# Patient Record
Sex: Male | Born: 1940 | Race: White | Hispanic: No | Marital: Married | Smoking: Never smoker
Health system: Southern US, Community
[De-identification: ages and names within clinical notes are randomized; demographics above are authoritative.]

## PROBLEM LIST (undated history)

## (undated) DIAGNOSIS — M5416 Radiculopathy, lumbar region: Secondary | ICD-10-CM

## (undated) DIAGNOSIS — I099 Rheumatic heart disease, unspecified: Secondary | ICD-10-CM

## (undated) DIAGNOSIS — I10 Essential (primary) hypertension: Secondary | ICD-10-CM

## (undated) DIAGNOSIS — C679 Malignant neoplasm of bladder, unspecified: Secondary | ICD-10-CM

## (undated) DIAGNOSIS — I350 Nonrheumatic aortic (valve) stenosis: Secondary | ICD-10-CM

## (undated) DIAGNOSIS — M19012 Primary osteoarthritis, left shoulder: Secondary | ICD-10-CM

## (undated) DIAGNOSIS — M19011 Primary osteoarthritis, right shoulder: Secondary | ICD-10-CM

## (undated) DIAGNOSIS — M7138 Other bursal cyst, other site: Secondary | ICD-10-CM

## (undated) HISTORY — DX: Primary osteoarthritis, left shoulder: M19.012

## (undated) HISTORY — DX: Other bursal cyst, other site: M71.38

## (undated) HISTORY — DX: Nonrheumatic aortic (valve) stenosis: I35.0

## (undated) HISTORY — PX: HERNIA REPAIR: SHX51

## (undated) HISTORY — DX: Primary osteoarthritis, right shoulder: M19.011

## (undated) HISTORY — PX: APPENDECTOMY: SHX54

## (undated) HISTORY — DX: Radiculopathy, lumbar region: M54.16

## (undated) HISTORY — DX: Malignant neoplasm of bladder, unspecified: C67.9

---

## 2015-03-24 ENCOUNTER — Other Ambulatory Visit: Payer: Self-pay | Admitting: Family Medicine

## 2015-03-24 DIAGNOSIS — R10819 Abdominal tenderness, unspecified site: Secondary | ICD-10-CM

## 2015-03-24 DIAGNOSIS — R1903 Right lower quadrant abdominal swelling, mass and lump: Secondary | ICD-10-CM

## 2015-04-01 ENCOUNTER — Other Ambulatory Visit: Payer: Self-pay

## 2015-07-08 DIAGNOSIS — Z01810 Encounter for preprocedural cardiovascular examination: Secondary | ICD-10-CM

## 2015-07-08 DIAGNOSIS — I10 Essential (primary) hypertension: Secondary | ICD-10-CM

## 2015-07-08 HISTORY — DX: Essential (primary) hypertension: I10

## 2015-07-08 HISTORY — DX: Encounter for preprocedural cardiovascular examination: Z01.810

## 2015-07-13 DIAGNOSIS — K4020 Bilateral inguinal hernia, without obstruction or gangrene, not specified as recurrent: Secondary | ICD-10-CM | POA: Insufficient documentation

## 2015-07-13 DIAGNOSIS — R1031 Right lower quadrant pain: Secondary | ICD-10-CM | POA: Insufficient documentation

## 2015-07-13 DIAGNOSIS — K439 Ventral hernia without obstruction or gangrene: Secondary | ICD-10-CM

## 2015-07-13 HISTORY — DX: Ventral hernia without obstruction or gangrene: K43.9

## 2015-07-13 HISTORY — DX: Right lower quadrant pain: R10.31

## 2015-07-13 HISTORY — DX: Bilateral inguinal hernia, without obstruction or gangrene, not specified as recurrent: K40.20

## 2015-09-29 DIAGNOSIS — Z09 Encounter for follow-up examination after completed treatment for conditions other than malignant neoplasm: Secondary | ICD-10-CM

## 2015-09-29 HISTORY — DX: Encounter for follow-up examination after completed treatment for conditions other than malignant neoplasm: Z09

## 2018-02-09 DIAGNOSIS — R55 Syncope and collapse: Secondary | ICD-10-CM

## 2018-02-09 DIAGNOSIS — I248 Other forms of acute ischemic heart disease: Secondary | ICD-10-CM

## 2018-02-09 DIAGNOSIS — I350 Nonrheumatic aortic (valve) stenosis: Secondary | ICD-10-CM

## 2018-02-09 DIAGNOSIS — I249 Acute ischemic heart disease, unspecified: Secondary | ICD-10-CM

## 2018-02-09 DIAGNOSIS — I5032 Chronic diastolic (congestive) heart failure: Secondary | ICD-10-CM

## 2018-02-10 DIAGNOSIS — I5032 Chronic diastolic (congestive) heart failure: Secondary | ICD-10-CM | POA: Diagnosis not present

## 2018-02-10 DIAGNOSIS — I248 Other forms of acute ischemic heart disease: Secondary | ICD-10-CM | POA: Diagnosis not present

## 2018-02-10 DIAGNOSIS — I350 Nonrheumatic aortic (valve) stenosis: Secondary | ICD-10-CM | POA: Diagnosis not present

## 2018-02-10 DIAGNOSIS — R55 Syncope and collapse: Secondary | ICD-10-CM | POA: Diagnosis not present

## 2018-02-11 ENCOUNTER — Inpatient Hospital Stay (HOSPITAL_COMMUNITY)
Admission: AD | Admit: 2018-02-11 | Discharge: 2018-02-22 | DRG: 217 | Disposition: A | Discharge status: Home / Self Care | Payer: Medicare Other | Source: Other Acute Inpatient Hospital | Attending: Thoracic Surgery (Cardiothoracic Vascular Surgery) | Admitting: Thoracic Surgery (Cardiothoracic Vascular Surgery)

## 2018-02-11 ENCOUNTER — Other Ambulatory Visit: Payer: Self-pay

## 2018-02-11 ENCOUNTER — Encounter (HOSPITAL_COMMUNITY): Payer: Self-pay

## 2018-02-11 DIAGNOSIS — Z9049 Acquired absence of other specified parts of digestive tract: Secondary | ICD-10-CM | POA: Diagnosis not present

## 2018-02-11 DIAGNOSIS — Z952 Presence of prosthetic heart valve: Secondary | ICD-10-CM

## 2018-02-11 DIAGNOSIS — K036 Deposits [accretions] on teeth: Secondary | ICD-10-CM | POA: Diagnosis present

## 2018-02-11 DIAGNOSIS — R55 Syncope and collapse: Secondary | ICD-10-CM

## 2018-02-11 DIAGNOSIS — D62 Acute posthemorrhagic anemia: Secondary | ICD-10-CM | POA: Diagnosis not present

## 2018-02-11 DIAGNOSIS — I480 Paroxysmal atrial fibrillation: Secondary | ICD-10-CM | POA: Diagnosis not present

## 2018-02-11 DIAGNOSIS — Y831 Surgical operation with implant of artificial internal device as the cause of abnormal reaction of the patient, or of later complication, without mention of misadventure at the time of the procedure: Secondary | ICD-10-CM | POA: Diagnosis not present

## 2018-02-11 DIAGNOSIS — E8770 Fluid overload, unspecified: Secondary | ICD-10-CM | POA: Diagnosis not present

## 2018-02-11 DIAGNOSIS — Z8619 Personal history of other infectious and parasitic diseases: Secondary | ICD-10-CM

## 2018-02-11 DIAGNOSIS — E860 Dehydration: Secondary | ICD-10-CM | POA: Diagnosis present

## 2018-02-11 DIAGNOSIS — Y92230 Patient room in hospital as the place of occurrence of the external cause: Secondary | ICD-10-CM | POA: Diagnosis not present

## 2018-02-11 DIAGNOSIS — Z01818 Encounter for other preprocedural examination: Secondary | ICD-10-CM

## 2018-02-11 DIAGNOSIS — I060 Rheumatic aortic stenosis: Secondary | ICD-10-CM | POA: Diagnosis present

## 2018-02-11 DIAGNOSIS — I248 Other forms of acute ischemic heart disease: Secondary | ICD-10-CM | POA: Diagnosis not present

## 2018-02-11 DIAGNOSIS — I97190 Other postprocedural cardiac functional disturbances following cardiac surgery: Secondary | ICD-10-CM | POA: Diagnosis not present

## 2018-02-11 DIAGNOSIS — Z803 Family history of malignant neoplasm of breast: Secondary | ICD-10-CM

## 2018-02-11 DIAGNOSIS — K083 Retained dental root: Secondary | ICD-10-CM | POA: Diagnosis not present

## 2018-02-11 DIAGNOSIS — I083 Combined rheumatic disorders of mitral, aortic and tricuspid valves: Principal | ICD-10-CM | POA: Diagnosis present

## 2018-02-11 DIAGNOSIS — N182 Chronic kidney disease, stage 2 (mild): Secondary | ICD-10-CM | POA: Diagnosis present

## 2018-02-11 DIAGNOSIS — I129 Hypertensive chronic kidney disease with stage 1 through stage 4 chronic kidney disease, or unspecified chronic kidney disease: Secondary | ICD-10-CM | POA: Diagnosis present

## 2018-02-11 DIAGNOSIS — I440 Atrioventricular block, first degree: Secondary | ICD-10-CM | POA: Diagnosis not present

## 2018-02-11 DIAGNOSIS — M264 Malocclusion, unspecified: Secondary | ICD-10-CM | POA: Diagnosis present

## 2018-02-11 DIAGNOSIS — K0530 Chronic periodontitis, unspecified: Secondary | ICD-10-CM | POA: Diagnosis present

## 2018-02-11 DIAGNOSIS — Z01810 Encounter for preprocedural cardiovascular examination: Secondary | ICD-10-CM | POA: Diagnosis not present

## 2018-02-11 DIAGNOSIS — Z09 Encounter for follow-up examination after completed treatment for conditions other than malignant neoplasm: Secondary | ICD-10-CM

## 2018-02-11 DIAGNOSIS — K029 Dental caries, unspecified: Secondary | ICD-10-CM | POA: Diagnosis present

## 2018-02-11 DIAGNOSIS — K0889 Other specified disorders of teeth and supporting structures: Secondary | ICD-10-CM | POA: Diagnosis present

## 2018-02-11 DIAGNOSIS — J9811 Atelectasis: Secondary | ICD-10-CM | POA: Diagnosis not present

## 2018-02-11 DIAGNOSIS — K06020 Generalized gingival recession, unspecified: Secondary | ICD-10-CM | POA: Diagnosis present

## 2018-02-11 DIAGNOSIS — I2510 Atherosclerotic heart disease of native coronary artery without angina pectoris: Secondary | ICD-10-CM | POA: Diagnosis present

## 2018-02-11 DIAGNOSIS — I350 Nonrheumatic aortic (valve) stenosis: Secondary | ICD-10-CM

## 2018-02-11 DIAGNOSIS — K0820 Unspecified atrophy of edentulous alveolar ridge: Secondary | ICD-10-CM | POA: Diagnosis not present

## 2018-02-11 DIAGNOSIS — Z972 Presence of dental prosthetic device (complete) (partial): Secondary | ICD-10-CM

## 2018-02-11 DIAGNOSIS — I5032 Chronic diastolic (congestive) heart failure: Secondary | ICD-10-CM | POA: Diagnosis not present

## 2018-02-11 DIAGNOSIS — K08409 Partial loss of teeth, unspecified cause, unspecified class: Secondary | ICD-10-CM | POA: Diagnosis present

## 2018-02-11 DIAGNOSIS — Z8249 Family history of ischemic heart disease and other diseases of the circulatory system: Secondary | ICD-10-CM

## 2018-02-11 DIAGNOSIS — Z9089 Acquired absence of other organs: Secondary | ICD-10-CM | POA: Diagnosis not present

## 2018-02-11 HISTORY — DX: Rheumatic heart disease, unspecified: I09.9

## 2018-02-11 HISTORY — DX: Syncope and collapse: R55

## 2018-02-11 HISTORY — DX: Essential (primary) hypertension: I10

## 2018-02-11 HISTORY — DX: Nonrheumatic aortic (valve) stenosis: I35.0

## 2018-02-11 LAB — CBC
HCT: 44 % (ref 39.0–52.0)
Hemoglobin: 14.7 g/dL (ref 13.0–17.0)
MCH: 32.2 pg (ref 26.0–34.0)
MCHC: 33.4 g/dL (ref 30.0–36.0)
MCV: 96.3 fL (ref 78.0–100.0)
Platelets: 198 10*3/uL (ref 150–400)
RBC: 4.57 MIL/uL (ref 4.22–5.81)
RDW: 13.2 % (ref 11.5–15.5)
WBC: 6 10*3/uL (ref 4.0–10.5)

## 2018-02-11 LAB — MRSA PCR SCREENING: MRSA BY PCR: NEGATIVE

## 2018-02-11 LAB — CREATININE, SERUM
Creatinine, Ser: 1.31 mg/dL — ABNORMAL HIGH (ref 0.61–1.24)
GFR, EST AFRICAN AMERICAN: 60 mL/min — AB (ref >60)
GFR, EST NON AFRICAN AMERICAN: 52 mL/min — AB (ref >60)

## 2018-02-11 MED ORDER — PNEUMOCOCCAL VAC POLYVALENT 25 MCG/0.5ML IJ INJ: 0.5000 mL | INJECTION | INTRAMUSCULAR | Status: DC

## 2018-02-11 MED ORDER — SODIUM CHLORIDE 0.9% FLUSH
3.0000 mL | Freq: Two times a day (BID) | INTRAVENOUS | Status: DC
  Administered 2018-02-11 – 2018-02-14 (×3): 3 mL via INTRAVENOUS

## 2018-02-11 MED ORDER — HEPARIN SODIUM (PORCINE) 5000 UNIT/ML IJ SOLN
5000.0000 [IU] | Freq: Three times a day (TID) | INTRAMUSCULAR | Status: DC
  Administered 2018-02-11 – 2018-02-14 (×8): 5000 [IU] via SUBCUTANEOUS
  Filled 2018-02-11 (×8): qty 1

## 2018-02-11 MED ORDER — SODIUM CHLORIDE 0.9 % IV SOLN
250.0000 mL | INTRAVENOUS | Status: DC | PRN
  Administered 2018-02-14: 09:00:00 via INTRAVENOUS

## 2018-02-11 MED ORDER — SODIUM CHLORIDE 0.9% FLUSH: 3.0000 mL | INTRAVENOUS | Status: DC | PRN

## 2018-02-11 NOTE — Progress Notes (Signed)
Pt arrived from [facility]. Pt was alert and oriented.  Vital signs obtained and were stable.  Pt oriented to unit. Call bell within reach.  Dr Doylene Canard at Bedside to assess pt.  Limited echo performed to assess severity of aortic stenosis.  Awaiting physician orders.
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
## 2018-02-12 ENCOUNTER — Other Ambulatory Visit: Payer: Self-pay | Admitting: *Deleted

## 2018-02-12 ENCOUNTER — Inpatient Hospital Stay (HOSPITAL_COMMUNITY): Payer: Medicare Other

## 2018-02-12 ENCOUNTER — Encounter (HOSPITAL_COMMUNITY): Payer: Self-pay | Admitting: *Deleted

## 2018-02-12 ENCOUNTER — Encounter (HOSPITAL_COMMUNITY)
Admission: AD | Disposition: A | Discharge status: Home / Self Care | Payer: Self-pay | Source: Other Acute Inpatient Hospital | Attending: Thoracic Surgery (Cardiothoracic Vascular Surgery)

## 2018-02-12 DIAGNOSIS — I350 Nonrheumatic aortic (valve) stenosis: Secondary | ICD-10-CM

## 2018-02-12 DIAGNOSIS — I2510 Atherosclerotic heart disease of native coronary artery without angina pectoris: Secondary | ICD-10-CM

## 2018-02-12 HISTORY — PX: RIGHT/LEFT HEART CATH AND CORONARY ANGIOGRAPHY: CATH118266

## 2018-02-12 LAB — POCT I-STAT 3, VENOUS BLOOD GAS (G3P V)
Acid-Base Excess: 1 mmol/L (ref 0.0–2.0)
Bicarbonate: 26.4 mmol/L (ref 20.0–28.0)
O2 Saturation: 68 %
TCO2: 28 mmol/L (ref 22–32)
pCO2, Ven: 45.8 mmHg (ref 44.0–60.0)
pH, Ven: 7.368 (ref 7.250–7.430)
pO2, Ven: 37 mmHg (ref 32.0–45.0)

## 2018-02-12 LAB — CBC
HCT: 43.2 % (ref 39.0–52.0)
Hemoglobin: 14.5 g/dL (ref 13.0–17.0)
MCH: 31.9 pg (ref 26.0–34.0)
MCHC: 33.6 g/dL (ref 30.0–36.0)
MCV: 95.2 fL (ref 78.0–100.0)
Platelets: 183 10*3/uL (ref 150–400)
RBC: 4.54 MIL/uL (ref 4.22–5.81)
RDW: 13.1 % (ref 11.5–15.5)
WBC: 7 10*3/uL (ref 4.0–10.5)

## 2018-02-12 LAB — PROTIME-INR
INR: 1.06
PROTHROMBIN TIME: 13.7 s (ref 11.4–15.2)

## 2018-02-12 LAB — POCT I-STAT 3, ART BLOOD GAS (G3+)
ACID-BASE EXCESS: 1 mmol/L (ref 0.0–2.0)
Bicarbonate: 25.8 mmol/L (ref 20.0–28.0)
O2 Saturation: 96 %
PCO2 ART: 39.1 mmHg (ref 32.0–48.0)
PO2 ART: 79 mmHg — AB (ref 83.0–108.0)
TCO2: 27 mmol/L (ref 22–32)
pH, Arterial: 7.428 (ref 7.350–7.450)

## 2018-02-12 LAB — BASIC METABOLIC PANEL
Anion gap: 11 (ref 5–15)
BUN: 23 mg/dL (ref 8–23)
CALCIUM: 9.1 mg/dL (ref 8.9–10.3)
CO2: 25 mmol/L (ref 22–32)
Chloride: 103 mmol/L (ref 98–111)
Creatinine, Ser: 1.27 mg/dL — ABNORMAL HIGH (ref 0.61–1.24)
GFR calc Af Amer: >60 mL/min (ref >60)
GFR, EST NON AFRICAN AMERICAN: 54 mL/min — AB (ref >60)
GLUCOSE: 99 mg/dL (ref 70–99)
Potassium: 3.9 mmol/L (ref 3.5–5.1)
SODIUM: 139 mmol/L (ref 135–145)

## 2018-02-12 SURGERY — RIGHT/LEFT HEART CATH AND CORONARY ANGIOGRAPHY
Anesthesia: LOCAL

## 2018-02-12 MED ORDER — IOHEXOL 350 MG/ML SOLN
INTRAVENOUS | Status: DC | PRN
  Administered 2018-02-12: 60 mL via INTRA_ARTERIAL

## 2018-02-12 MED ORDER — SODIUM CHLORIDE 0.9% FLUSH
3.0000 mL | Freq: Two times a day (BID) | INTRAVENOUS | Status: DC
  Administered 2018-02-12 – 2018-02-13 (×4): 3 mL via INTRAVENOUS

## 2018-02-12 MED ORDER — SODIUM CHLORIDE 0.9 % IV SOLN: INTRAVENOUS | Status: DC

## 2018-02-12 MED ORDER — SODIUM CHLORIDE 0.9 % IV SOLN: 250.0000 mL | INTRAVENOUS | Status: DC | PRN

## 2018-02-12 MED ORDER — LIDOCAINE HCL (PF) 1 % IJ SOLN
INTRAMUSCULAR | Status: DC | PRN
  Administered 2018-02-12: 15 mL via SUBCUTANEOUS

## 2018-02-12 MED ORDER — FENTANYL CITRATE (PF) 100 MCG/2ML IJ SOLN
INTRAMUSCULAR | Status: AC
  Filled 2018-02-12: qty 2

## 2018-02-12 MED ORDER — SODIUM CHLORIDE 0.9% FLUSH: 3.0000 mL | INTRAVENOUS | Status: DC | PRN

## 2018-02-12 MED ORDER — SODIUM CHLORIDE 0.9 % IV SOLN
INTRAVENOUS | Status: DC
  Administered 2018-02-12: 22:00:00 via INTRAVENOUS

## 2018-02-12 MED ORDER — ACETAMINOPHEN 325 MG PO TABS: 650.0000 mg | ORAL_TABLET | ORAL | Status: DC | PRN

## 2018-02-12 MED ORDER — HEPARIN (PORCINE) IN NACL 1000-0.9 UT/500ML-% IV SOLN
INTRAVENOUS | Status: AC
  Filled 2018-02-12: qty 1000

## 2018-02-12 MED ORDER — ASPIRIN 81 MG PO CHEW
81.0000 mg | CHEWABLE_TABLET | ORAL | Status: AC
  Administered 2018-02-12: 81 mg via ORAL
  Filled 2018-02-12: qty 1

## 2018-02-12 MED ORDER — MIDAZOLAM HCL 2 MG/2ML IJ SOLN
INTRAMUSCULAR | Status: DC | PRN
  Administered 2018-02-12: 1 mg via INTRAVENOUS

## 2018-02-12 MED ORDER — SODIUM CHLORIDE 0.9 % IV SOLN: INTRAVENOUS | Status: AC

## 2018-02-12 MED ORDER — SODIUM CHLORIDE 0.9% FLUSH: 3.0000 mL | Freq: Two times a day (BID) | INTRAVENOUS | Status: DC

## 2018-02-12 MED ORDER — ONDANSETRON HCL 4 MG/2ML IJ SOLN: 4.0000 mg | Freq: Four times a day (QID) | INTRAMUSCULAR | Status: DC | PRN

## 2018-02-12 MED ORDER — LIDOCAINE HCL (PF) 1 % IJ SOLN
INTRAMUSCULAR | Status: AC
  Filled 2018-02-12: qty 30

## 2018-02-12 MED ORDER — FENTANYL CITRATE (PF) 100 MCG/2ML IJ SOLN
INTRAMUSCULAR | Status: DC | PRN
  Administered 2018-02-12: 25 ug via INTRAVENOUS

## 2018-02-12 MED ORDER — MIDAZOLAM HCL 2 MG/2ML IJ SOLN
INTRAMUSCULAR | Status: AC
  Filled 2018-02-12: qty 2

## 2018-02-12 SURGICAL SUPPLY — 9 items
CATH INFINITI 5FR MULTPACK ANG (CATHETERS) ×2 IMPLANT
CATH SWAN GANZ 7F STRAIGHT (CATHETERS) ×2 IMPLANT
KIT HEART LEFT (KITS) ×2 IMPLANT
PACK CARDIAC CATHETERIZATION (CUSTOM PROCEDURE TRAY) ×2 IMPLANT
SHEATH PINNACLE 5F 10CM (SHEATH) ×2 IMPLANT
SHEATH PINNACLE 7F 10CM (SHEATH) ×2 IMPLANT
TRANSDUCER W/STOPCOCK (MISCELLANEOUS) ×2 IMPLANT
WIRE EMERALD 3MM-J .025X260CM (WIRE) ×2 IMPLANT
WIRE EMERALD 3MM-J .035X150CM (WIRE) ×2 IMPLANT

## 2018-02-12 NOTE — Plan of Care (Signed)
Patient educated on purpose of subcutaneous heparin and was able to successfully teach back this purpose after 1 hour had elapsed.

## 2018-02-12 NOTE — Interval H&P Note (Signed)
History and Physical Interval Note:  02/12/2018 9:08 AM  Jon Ferguson  has presented today for surgery, with the diagnosis of unstable angina  The various methods of treatment have been discussed with the patient and family. After consideration of risks, benefits and other options for treatment, the patient has consented to  Procedure(s): RIGHT/LEFT HEART CATH AND CORONARY ANGIOGRAPHY (N/A) as a surgical intervention .  The patient's history has been reviewed, patient examined, no change in status, stable for surgery.  I have reviewed the patient's chart and labs.  Questions were answered to the patient's satisfaction.     Birdie Riddle
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
## 2018-02-12 NOTE — Progress Notes (Addendum)
Site area: Right groin a 5 french arterial and 7 french venous sheath was removed Pharmacologist  Site Prior to Removal:  Level 0  Pressure Applied For 20 MINUTES    Bedrest Beginning at 1035am  Manual:   Yes.    Patient Status During Pull:  stable  Post Pull Groin Site:  Level 0  Post Pull Instructions Given:  Yes.    Post Pull Pulses Present:  Yes.    Dressing Applied:  Yes.    Comments:  VS remain stable
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
## 2018-02-13 ENCOUNTER — Encounter (HOSPITAL_COMMUNITY)
Admission: AD | Disposition: A | Discharge status: Home / Self Care | Payer: Self-pay | Source: Other Acute Inpatient Hospital | Attending: Thoracic Surgery (Cardiothoracic Vascular Surgery)

## 2018-02-13 ENCOUNTER — Inpatient Hospital Stay (HOSPITAL_COMMUNITY): Payer: Medicare Other

## 2018-02-13 ENCOUNTER — Encounter (HOSPITAL_COMMUNITY): Payer: Self-pay

## 2018-02-13 DIAGNOSIS — Z01810 Encounter for preprocedural cardiovascular examination: Secondary | ICD-10-CM

## 2018-02-13 HISTORY — PX: TEE WITHOUT CARDIOVERSION: SHX5443

## 2018-02-13 LAB — CBC
HCT: 49.7 % (ref 39.0–52.0)
Hemoglobin: 16.4 g/dL (ref 13.0–17.0)
MCH: 31.6 pg (ref 26.0–34.0)
MCHC: 33 g/dL (ref 30.0–36.0)
MCV: 95.8 fL (ref 80.0–100.0)
Platelets: 208 10*3/uL (ref 150–400)
RBC: 5.19 MIL/uL (ref 4.22–5.81)
RDW: 13.2 % (ref 11.5–15.5)
WBC: 9.9 10*3/uL (ref 4.0–10.5)

## 2018-02-13 LAB — BASIC METABOLIC PANEL
ANION GAP: 8 (ref 5–15)
BUN: 21 mg/dL (ref 8–23)
CALCIUM: 9.5 mg/dL (ref 8.9–10.3)
CO2: 27 mmol/L (ref 22–32)
Chloride: 103 mmol/L (ref 98–111)
Creatinine, Ser: 1.39 mg/dL — ABNORMAL HIGH (ref 0.61–1.24)
GFR, EST AFRICAN AMERICAN: 56 mL/min — AB (ref >60)
GFR, EST NON AFRICAN AMERICAN: 48 mL/min — AB (ref >60)
Glucose, Bld: 103 mg/dL — ABNORMAL HIGH (ref 70–99)
Potassium: 4 mmol/L (ref 3.5–5.1)
Sodium: 138 mmol/L (ref 135–145)

## 2018-02-13 LAB — PULMONARY FUNCTION TEST
DL/VA % PRED: 95 %
DL/VA: 4.46 ml/min/mmHg/L
DLCO cor % pred: 83 %
DLCO cor: 28.08 ml/min/mmHg
DLCO unc % pred: 87 %
DLCO unc: 29.41 ml/min/mmHg
FEF 25-75 Post: 3.44 L/sec
FEF 25-75 Pre: 2.37 L/sec
FEF2575-%CHANGE-POST: 45 %
FEF2575-%Pred-Post: 150 %
FEF2575-%Pred-Pre: 104 %
FEV1-%Change-Post: 11 %
FEV1-%PRED-PRE: 94 %
FEV1-%Pred-Post: 105 %
FEV1-PRE: 3 L
FEV1-Post: 3.35 L
FEV1FVC-%CHANGE-POST: 10 %
FEV1FVC-%Pred-Pre: 100 %
FEV6-%CHANGE-POST: 3 %
FEV6-%Pred-Post: 101 %
FEV6-%Pred-Pre: 98 %
FEV6-POST: 4.17 L
FEV6-PRE: 4.04 L
FEV6FVC-%Change-Post: 1 %
FEV6FVC-%PRED-POST: 106 %
FEV6FVC-%Pred-Pre: 104 %
FVC-%Change-Post: 1 %
FVC-%PRED-POST: 95 %
FVC-%PRED-PRE: 94 %
FVC-POST: 4.18 L
FVC-Pre: 4.14 L
POST FEV6/FVC RATIO: 100 %
Post FEV1/FVC ratio: 80 %
Pre FEV1/FVC ratio: 73 %
Pre FEV6/FVC Ratio: 98 %
RV % PRED: 105 %
RV: 2.76 L
TLC % pred: 96 %
TLC: 6.98 L

## 2018-02-13 SURGERY — ECHOCARDIOGRAM, TRANSESOPHAGEAL
Anesthesia: Moderate Sedation

## 2018-02-13 MED ORDER — MIDAZOLAM HCL 5 MG/ML IJ SOLN
INTRAMUSCULAR | Status: AC
  Filled 2018-02-13: qty 2

## 2018-02-13 MED ORDER — BUTAMBEN-TETRACAINE-BENZOCAINE 2-2-14 % EX AERO
INHALATION_SPRAY | CUTANEOUS | Status: DC | PRN
  Administered 2018-02-13: 2 via TOPICAL

## 2018-02-13 MED ORDER — ALBUTEROL SULFATE (2.5 MG/3ML) 0.083% IN NEBU
2.5000 mg | INHALATION_SOLUTION | Freq: Once | RESPIRATORY_TRACT | Status: AC
  Administered 2018-02-13: 2.5 mg via RESPIRATORY_TRACT

## 2018-02-13 MED ORDER — FENTANYL CITRATE (PF) 100 MCG/2ML IJ SOLN
INTRAMUSCULAR | Status: DC | PRN
  Administered 2018-02-13: 25 ug via INTRAVENOUS

## 2018-02-13 MED ORDER — MIDAZOLAM HCL 10 MG/2ML IJ SOLN
INTRAMUSCULAR | Status: DC | PRN
  Administered 2018-02-13: 2 mg via INTRAVENOUS
  Administered 2018-02-13: 1 mg via INTRAVENOUS

## 2018-02-13 MED ORDER — FENTANYL CITRATE (PF) 100 MCG/2ML IJ SOLN
INTRAMUSCULAR | Status: AC
  Filled 2018-02-13: qty 2

## 2018-02-13 MED ORDER — DIPHENHYDRAMINE HCL 50 MG/ML IJ SOLN
INTRAMUSCULAR | Status: AC
  Filled 2018-02-13: qty 1

## 2018-02-13 MED FILL — Heparin Sod (Porcine)-NaCl IV Soln 1000 Unit/500ML-0.9%: INTRAVENOUS | Qty: 1000 | Status: AC

## 2018-02-13 NOTE — Consult Note (Signed)
DENTAL CONSULTATION  Date of Consultation:  02/13/2018 Patient Name:   Jon Ferguson Date of Birth:   [birth year] Medical Record Number: [id number]  VITALS: BP 137/90   Pulse 69   Temp 99.1 F (37.3 C) (Oral)   Resp 15   Ht 5\' 11"  (1.803 m)   Wt 78.2 kg   SpO2 97%   BMI 24.04 kg/m   CHIEF COMPLAINT: Patient referred by Dr. Roxan Hockey for dental consultation.  HPI: Jon Ferguson is a 77 year old male recently diagnosed with severe aortic stenosis.  Patient with anticipated aortic valve replacement in the future.  Patient is now seen as part of a medically necessary pre-heart valve surgery dental protocol examination rule out dental infection that may affect the patient systemic health anticipated heart valve surgery.  Patient denies having any acute toothaches, swellings, or abscesses. Patient last saw a dentist 3 to 4 years ago.  Patient had multiple teeth extracted and immediate upper complete denture inserted at that time.  This was by a dentist located on [address] .  The upper complete denture fits well.  The patient does not have a lower partial denture.  Patient does not have dental phobia.  PROBLEM LIST: Patient Active Problem List   Diagnosis Date Noted  . Syncope 02/11/2018    Class: Acute  . Aortic valve stenosis 02/11/2018    Class: Chronic    PMH: Past Medical History:  Diagnosis Date  . Hypertension   . Rheumatic heart disease     PSH: Past Surgical History:  Procedure Laterality Date  . APPENDECTOMY    . HERNIA REPAIR    . RIGHT/LEFT HEART CATH AND CORONARY ANGIOGRAPHY N/A 02/12/2018   Procedure: RIGHT/LEFT HEART CATH AND CORONARY ANGIOGRAPHY;  Surgeon: Dixie Dials, MD;  Location: [facility];  Service: Cardiovascular;  Laterality: N/A;    ALLERGIES: No Known Allergies  MEDICATIONS: Current Facility-Administered Medications  Medication Dose Route Frequency Provider Last Rate Last Dose  . 0.9 %  sodium chloride infusion  250 mL Intravenous PRN  Dixie Dials, MD      . 0.9 %  sodium chloride infusion   Intravenous Continuous Doylene Canard, Ajay, MD      . 0.9 %  sodium chloride infusion  250 mL Intravenous PRN Dixie Dials, MD      . acetaminophen (TYLENOL) tablet 650 mg  650 mg Oral Q4H PRN Dixie Dials, MD      . heparin injection 5,000 Units  5,000 Units Subcutaneous Q8H Dixie Dials, MD   5,000 Units at 02/13/18 0501  . ondansetron (ZOFRAN) injection 4 mg  4 mg Intravenous Q6H PRN Dixie Dials, MD      . pneumococcal 23 valent vaccine (PNU-IMMUNE) injection 0.5 mL  0.5 mL Intramuscular Tomorrow-1000 Dixie Dials, MD      . sodium chloride flush (NS) 0.9 % injection 3 mL  3 mL Intravenous Q12H Dixie Dials, MD   3 mL at 02/13/18 1146  . sodium chloride flush (NS) 0.9 % injection 3 mL  3 mL Intravenous PRN Dixie Dials, MD      . sodium chloride flush (NS) 0.9 % injection 3 mL  3 mL Intravenous Q12H Dixie Dials, MD   3 mL at 02/13/18 1145  . sodium chloride flush (NS) 0.9 % injection 3 mL  3 mL Intravenous PRN Dixie Dials, MD        LABS: Lab Results  Component Value Date   WBC 9.9 02/13/2018   HGB 16.4 02/13/2018   HCT 49.7  02/13/2018   MCV 95.8 02/13/2018   PLT 208 02/13/2018      Component Value Date/Time   NA 138 02/13/2018 0529   K 4.0 02/13/2018 0529   CL 103 02/13/2018 0529   CO2 27 02/13/2018 0529   GLUCOSE 103 (H) 02/13/2018 0529   BUN 21 02/13/2018 0529   CREATININE 1.39 (H) 02/13/2018 0529   CALCIUM 9.5 02/13/2018 0529   GFRNONAA 48 (L) 02/13/2018 0529   GFRAA 56 (L) 02/13/2018 0529   Lab Results  Component Value Date   INR 1.06 02/12/2018   No results found for: PTT  SOCIAL HISTORY: Social History   Socioeconomic History  . Marital status: Married    Spouse name: Not on file  . Number of children: Not on file  . Years of education: Not on file  . Highest education level: Not on file  Occupational History  . Not on file  Social Needs  . Financial resource strain: Not on file  . Food  insecurity:    Worry: Not on file    Inability: Not on file  . Transportation needs:    Medical: Not on file    Non-medical: Not on file  Tobacco Use  . Smoking status: Never Smoker  . Smokeless tobacco: Never Used  Substance and Sexual Activity  . Alcohol use: Never    Frequency: Never  . Drug use: Never  . Sexual activity: Not on file  Lifestyle  . Physical activity:    Days per week: Not on file    Minutes per session: Not on file  . Stress: Not on file  Relationships  . Social connections:    Talks on phone: Not on file    Gets together: Not on file    Attends religious service: Not on file    Active member of club or organization: Not on file    Attends meetings of clubs or organizations: Not on file    Relationship status: Not on file  . Intimate partner violence:    Fear of current or ex partner: Not on file    Emotionally abused: Not on file    Physically abused: Not on file    Forced sexual activity: Not on file  Other Topics Concern  . Not on file  Social History Narrative  . Not on file    FAMILY HISTORY: History reviewed. No pertinent family history.  REVIEW OF SYSTEMS: Reviewed with the patient as per History of present illness. Psych: Patient denies having dental phobia.  DENTAL HISTORY: CHIEF COMPLAINT: Patient referred by Dr. Roxan Hockey for dental consultation.  HPI: Jon Ferguson is a 77 year old male recently diagnosed with severe aortic stenosis.  Patient with anticipated aortic valve replacement in the future.  Patient is now seen as part of a medically necessary pre-heart valve surgery dental protocol examination rule out dental infection that may affect the patient systemic health anticipated heart valve surgery.  Patient denies having any acute toothaches, swellings, or abscesses. Patient last saw a dentist 3 to 4 years ago.  Patient had multiple teeth extracted and immediate upper complete denture inserted at that time.  This was by a dentist  located on [address] .  The upper complete denture fits well.  The patient does not have a lower partial denture.  Patient does not have dental phobia.  DENTAL EXAMINATION:    GENERAL: The patient is a well-developed, well-nourished male in no acute distress. HEAD AND NECK: No palpable neck lymphadenopathy.  The patient  denies acute TMJ symptoms. INTRAORAL EXAM: The patient has normal saliva.  Patient is missing all teeth with exception of tooth numbers 27 and 28. DENTITION: Patient is missing all teeth with the exception of tooth numbers 27 and 28.  Tooth #28 is retained root segment.  Candiss Norse is PERIODONTAL: Patient has chronic periodontitis with plaque calculus accumulations, gingival recession, and incipient tooth mobility. DENTAL CARIES/SUBOPTIMAL RESTORATIONS: Tooth #27 and 28 are affected by dental caries. ENDODONTIC: Patient currently denies acute pulpitis symptoms. CROWN AND BRIDGE: No crown and bridge restorations. PROSTHODONTIC: The patient has an upper complete denture that fits well by patient report.  There is no lower partial denture. OCCLUSION: The patient has a poor occlusal scheme secondary to multiple bruising teeth, retained root segment, and lack replacement of all missing teeth with dental prostheses. RADIOGRAPHIC INTERPRETATION: Orthopantogram was taken on 02/12/2018.  This is suboptimal. Patient is missing all maxillary teeth.  Patient has several remaining lower teeth.  I am unable to determine if the patient has any periapical pathology or radiolucencies.   ASSESSMENTS: 1.  Severe aortic stenosis 2.  Pre-heart valve surgery dental protocol 3.  Dental caries 4.  Retained root segment #28 5.  Chronic periodontitis of bone loss 6.   Gingival recession 7.  Incipient tooth mobility 8.  Accretions 9.  Multiple missing teeth 10.  Upper complete denture and  no lower partial denture 11.  Poor occlusal scheme and malocclusion  PLAN/RECOMMENDATIONS: 1. I discussed the  risks, benefits, and complications of various treatment options with the patient in relationship to his medical and dental conditions, aortic stenosis, and anticipated heart valve surgery. We discussed various treatment options to include no treatment, multiple extractions with alveoloplasty, pre-prosthetic surgery as indicated, periodontal therapy, dental restorations, root canal therapy, crown and bridge therapy, implant therapy, and replacement of missing teeth as indicated. The patient currently wishes to proceed with extraction of remaining tooth numbers 27 and 28 with alveoloplasty in the operating room with either general anesthesia or monitored anesthesia care.  The patient then follow-up with a dentist of his choice for fabrication of new upper and lower complete dentures or a lower denture match his existing upper complete denture.  The dental operating room procedure has been scheduled for approximately 8:30 AM this morning.  2. Discussion of findings with medical team and coordination of future medical and dental care as needed.    Lenn Cal, DDS

## 2018-02-13 NOTE — Interval H&P Note (Signed)
History and Physical Interval Note:  02/13/2018 7:44 AM  Jon Ferguson  has presented today for surgery, with the diagnosis of Aortic Valve stenosis  The various methods of treatment have been discussed with the patient and family. After consideration of risks, benefits and other options for treatment, the patient has consented to  Procedure(s): TRANSESOPHAGEAL ECHOCARDIOGRAM (TEE) (N/A) as a surgical intervention .  The patient's history has been reviewed, patient examined, no change in status, stable for surgery.  I have reviewed the patient's chart and labs.  Questions were answered to the patient's satisfaction.     Birdie Riddle

## 2018-02-13 NOTE — Progress Notes (Signed)
Pre-op Cardiac Surgery  Carotid Findings:  Bilateral ICAs 1-39% stenosis. Bilateral vertebral arteries patent with antegrade flow.  Upper Extremity Right Left  Brachial Waveforms Biphasic Biphasic  Radial Waveforms Biphasic Triphasic  Ulnar Waveforms Biphasic Biphasic  Palmar Arch (Allen's Test) See below See below   Findings:   Right Upper Extremity: Doppler waveform obliterate with right radial compression. Doppler waveforms remain within normal limits with right ulnar compression.  Left Upper Extremity: Doppler waveform obliterate with left radial compression. Doppler waveforms decrease >50% with left ulnar compression.  Jon Ferguson (RDMS RVT) 02/13/18 1:13 PM

## 2018-02-13 NOTE — Progress Notes (Signed)
2D Echocardiogram has been performed.  02/13/2018, 8:49 AM

## 2018-02-13 NOTE — Progress Notes (Signed)
Pt. To Endo for TEE.

## 2018-02-13 NOTE — CV Procedure (Signed)
INDICATIONS:   The patient is 78 year old male has Severe AS with mild MR and TR.  PROCEDURE:  Informed consent was discussed including risks, benefits and alternatives for the procedure.  Risks include, but are not limited to, cough, sore throat, vomiting, nausea, somnolence, esophageal and stomach trauma or perforation, bleeding, low blood pressure, aspiration, pneumonia, infection, trauma to the teeth and death.    Patient was given sedation.  The oropharynx was anesthetized with topical lidocaine.  The transesophageal probe was inserted in the esophagus and stomach and multiple views were obtained.  Agitated saline was used after the transesophageal probe was removed from the body.  The patient was kept under observation until the patient left the procedure room.  The patient left the procedure room in stable condition.   COMPLICATIONS:  There were no immediate complications.  FINDINGS:  1. LEFT VENTRICLE: The left ventricle is normal in size with mild LVH. Wall motion is normal. EF 50-55 % No thrombus or masses seen in the left ventricle.  2. RIGHT VENTRICLE:  The right ventricle is normal in structure and function without any thrombus or masses.    3. LEFT ATRIUM:  The left atrium is normal without any thrombus or masses.  4. LEFT ATRIAL APPENDAGE:  The left atrial appendage is free of any thrombus or masses.  5. RIGHT ATRIUM:  The right atrium is free of any thrombus or masses.    6. ATRIAL SEPTUM:  The atrial septum is normal without any ASD or PFO. Negative "bubble" study.  7. MITRAL VALVE:  The mitral valve is normal in structure and function with multiple jets of  Mild to moderate regurgitation, no masses, stenosis or vegetations.  8. TRICUSPID VALVE:  The tricuspid valve is normal in structure and function with mild regurgitation, no masses, stenosis or vegetations.  9. AORTIC VALVE:  The aortic valve is highly calcific with resticted motion. It has severe stenosis with at least  65 mm of mean gradient and valve area of 0.8 cm2 by planimetry. It has mild to moderate regurgitation..  10. PULMONIC VALVE:  The pulmonic valve is normal in structure and function with mild regurgitation, no masses, stenosis or vegetations.  11. AORTIC ARCH, ASCENDING AND DESCENDING AORTA:  The aorta had mild diffuse atherosclerosis in the ascending or descending aorta.  The aortic arch was normal.  12.  Superior Vena Cava : No thrombus or catheter.  13.  Pulmonary Veins: Visible.  14.  Pulmonary artery: visible and normal.   IMPRESSION:   1. Preserved LV systolic function with mild LVH. 2. Calcific severe AS with mild to moderate AI. 3. Mild to moderate MR with multiple jets. 4. Mild PV regurgitation and mild TR. 5. Negative ASD or PFO.  RECOMMENDATIONS:    CVTS consult for AV surgery.Jon Ferguson

## 2018-02-14 ENCOUNTER — Inpatient Hospital Stay (HOSPITAL_COMMUNITY): Payer: Medicare Other

## 2018-02-14 ENCOUNTER — Inpatient Hospital Stay (HOSPITAL_COMMUNITY): Payer: Medicare Other | Admitting: Certified Registered Nurse Anesthetist

## 2018-02-14 ENCOUNTER — Encounter (HOSPITAL_COMMUNITY)
Admission: AD | Disposition: A | Discharge status: Home / Self Care | Payer: Self-pay | Source: Other Acute Inpatient Hospital | Attending: Thoracic Surgery (Cardiothoracic Vascular Surgery)

## 2018-02-14 DIAGNOSIS — K0889 Other specified disorders of teeth and supporting structures: Secondary | ICD-10-CM | POA: Diagnosis present

## 2018-02-14 DIAGNOSIS — K029 Dental caries, unspecified: Secondary | ICD-10-CM | POA: Diagnosis present

## 2018-02-14 DIAGNOSIS — K0530 Chronic periodontitis, unspecified: Secondary | ICD-10-CM | POA: Diagnosis present

## 2018-02-14 DIAGNOSIS — K083 Retained dental root: Secondary | ICD-10-CM | POA: Diagnosis present

## 2018-02-14 HISTORY — PX: MULTIPLE EXTRACTIONS WITH ALVEOLOPLASTY: SHX5342

## 2018-02-14 LAB — TYPE AND SCREEN
ABO/RH(D): A POS
ANTIBODY SCREEN: NEGATIVE

## 2018-02-14 LAB — ABO/RH: ABO/RH(D): A POS

## 2018-02-14 LAB — HEMOGLOBIN A1C
Hgb A1c MFr Bld: 5 % (ref 4.8–5.6)
MEAN PLASMA GLUCOSE: 96.8 mg/dL

## 2018-02-14 SURGERY — MULTIPLE EXTRACTION WITH ALVEOLOPLASTY
Anesthesia: General | Site: Mouth

## 2018-02-14 MED ORDER — OXYMETAZOLINE HCL 0.05 % NA SOLN
NASAL | Status: AC
  Filled 2018-02-14: qty 15

## 2018-02-14 MED ORDER — METOPROLOL TARTRATE 12.5 MG HALF TABLET: 12.5000 mg | ORAL_TABLET | Freq: Once | ORAL | Status: DC

## 2018-02-14 MED ORDER — LIDOCAINE-EPINEPHRINE 2 %-1:100000 IJ SOLN
INTRAMUSCULAR | Status: AC
  Filled 2018-02-14: qty 6.8

## 2018-02-14 MED ORDER — EPHEDRINE SULFATE 50 MG/ML IJ SOLN
INTRAMUSCULAR | Status: DC | PRN
  Administered 2018-02-14 (×2): 5 mg via INTRAVENOUS

## 2018-02-14 MED ORDER — BUPIVACAINE-EPINEPHRINE 0.5% -1:200000 IJ SOLN
INTRAMUSCULAR | Status: DC | PRN
  Administered 2018-02-14: 3.4 mL

## 2018-02-14 MED ORDER — HEMOSTATIC AGENTS (NO CHARGE) OPTIME
TOPICAL | Status: DC | PRN
  Administered 2018-02-14: 1 via TOPICAL

## 2018-02-14 MED ORDER — BISACODYL 5 MG PO TBEC
5.0000 mg | DELAYED_RELEASE_TABLET | Freq: Once | ORAL | Status: AC
  Administered 2018-02-15: 5 mg via ORAL
  Filled 2018-02-14: qty 1

## 2018-02-14 MED ORDER — BOOST / RESOURCE BREEZE PO LIQD CUSTOM
1.0000 | Freq: Three times a day (TID) | ORAL | Status: DC
  Administered 2018-02-14 – 2018-02-15 (×3): 1 via ORAL

## 2018-02-14 MED ORDER — FENTANYL CITRATE (PF) 250 MCG/5ML IJ SOLN
INTRAMUSCULAR | Status: AC
  Filled 2018-02-14: qty 5

## 2018-02-14 MED ORDER — MIDAZOLAM HCL 2 MG/2ML IJ SOLN
INTRAMUSCULAR | Status: AC
  Filled 2018-02-14: qty 2

## 2018-02-14 MED ORDER — PROPOFOL 10 MG/ML IV BOLUS
INTRAVENOUS | Status: DC | PRN
  Administered 2018-02-14: 110 mg via INTRAVENOUS

## 2018-02-14 MED ORDER — SUGAMMADEX SODIUM 200 MG/2ML IV SOLN
INTRAVENOUS | Status: DC | PRN
  Administered 2018-02-14: 153.6 mg via INTRAVENOUS

## 2018-02-14 MED ORDER — FENTANYL CITRATE (PF) 250 MCG/5ML IJ SOLN
INTRAMUSCULAR | Status: DC | PRN
  Administered 2018-02-14: 100 ug via INTRAVENOUS

## 2018-02-14 MED ORDER — METOPROLOL TARTRATE 12.5 MG HALF TABLET
12.5000 mg | ORAL_TABLET | Freq: Once | ORAL | Status: AC
  Administered 2018-02-16: 12.5 mg via ORAL
  Filled 2018-02-14: qty 1

## 2018-02-14 MED ORDER — CEFAZOLIN SODIUM-DEXTROSE 2-4 GM/100ML-% IV SOLN
2.0000 g | Freq: Once | INTRAVENOUS | Status: AC
  Administered 2018-02-14: 2 g via INTRAVENOUS

## 2018-02-14 MED ORDER — BUPIVACAINE-EPINEPHRINE (PF) 0.5% -1:200000 IJ SOLN
INTRAMUSCULAR | Status: AC
  Filled 2018-02-14: qty 3.6

## 2018-02-14 MED ORDER — DIAZEPAM 5 MG PO TABS
5.0000 mg | ORAL_TABLET | Freq: Once | ORAL | Status: AC
  Administered 2018-02-16: 5 mg via ORAL
  Filled 2018-02-14: qty 1

## 2018-02-14 MED ORDER — BISACODYL 5 MG PO TBEC: 5.0000 mg | DELAYED_RELEASE_TABLET | Freq: Once | ORAL | Status: DC

## 2018-02-14 MED ORDER — LIDOCAINE HCL (CARDIAC) PF 100 MG/5ML IV SOSY
PREFILLED_SYRINGE | INTRAVENOUS | Status: DC | PRN
  Administered 2018-02-14: 60 mg via INTRAVENOUS

## 2018-02-14 MED ORDER — PROPOFOL 10 MG/ML IV BOLUS
INTRAVENOUS | Status: AC
  Filled 2018-02-14: qty 40

## 2018-02-14 MED ORDER — DIAZEPAM 5 MG PO TABS: 5.0000 mg | ORAL_TABLET | Freq: Once | ORAL | Status: DC

## 2018-02-14 MED ORDER — SODIUM CHLORIDE 0.9 % IV SOLN
INTRAVENOUS | Status: DC
  Administered 2018-02-14 – 2018-02-16 (×3): via INTRAVENOUS

## 2018-02-14 MED ORDER — PHENYLEPHRINE HCL 10 MG/ML IJ SOLN
INTRAMUSCULAR | Status: DC | PRN
  Administered 2018-02-14: 120 ug via INTRAVENOUS

## 2018-02-14 MED ORDER — LIDOCAINE-EPINEPHRINE 2 %-1:100000 IJ SOLN
INTRAMUSCULAR | Status: DC | PRN
  Administered 2018-02-14: 1.7 mL

## 2018-02-14 MED ORDER — 0.9 % SODIUM CHLORIDE (POUR BTL) OPTIME
TOPICAL | Status: DC | PRN
  Administered 2018-02-14: 1000 mL

## 2018-02-14 MED ORDER — ONDANSETRON HCL 4 MG/2ML IJ SOLN
INTRAMUSCULAR | Status: DC | PRN
  Administered 2018-02-14: 4 mg via INTRAVENOUS

## 2018-02-14 MED ORDER — ROCURONIUM BROMIDE 100 MG/10ML IV SOLN
INTRAVENOUS | Status: DC | PRN
  Administered 2018-02-14: 30 mg via INTRAVENOUS

## 2018-02-14 MED ORDER — FENTANYL CITRATE (PF) 100 MCG/2ML IJ SOLN: 25.0000 ug | INTRAMUSCULAR | Status: DC | PRN

## 2018-02-14 MED ORDER — DEXAMETHASONE SODIUM PHOSPHATE 4 MG/ML IJ SOLN
INTRAMUSCULAR | Status: DC | PRN
  Administered 2018-02-14: 4 mg via INTRAVENOUS

## 2018-02-14 MED ORDER — SODIUM CHLORIDE 0.9 % IV SOLN
INTRAVENOUS | Status: DC | PRN
  Administered 2018-02-14: 25 ug/min via INTRAVENOUS

## 2018-02-14 MED ORDER — TEMAZEPAM 7.5 MG PO CAPS: 15.0000 mg | ORAL_CAPSULE | Freq: Once | ORAL | Status: DC | PRN

## 2018-02-14 MED ORDER — LACTATED RINGERS IV SOLN: INTRAVENOUS | Status: DC

## 2018-02-14 SURGICAL SUPPLY — 40 items
ALCOHOL 70% 16 OZ (MISCELLANEOUS) ×3 IMPLANT
ATTRACTOMAT 16X20 MAGNETIC DRP (DRAPES) ×3 IMPLANT
BANDAGE HEMOSTAT MRDH 4X4 STRL (MISCELLANEOUS) IMPLANT
BLADE SURG 15 STRL LF DISP TIS (BLADE) ×2 IMPLANT
BLADE SURG 15 STRL SS (BLADE) ×4
BNDG HEMOSTAT MRDH 4X4 STRL (MISCELLANEOUS)
COVER SURGICAL LIGHT HANDLE (MISCELLANEOUS) IMPLANT
COVER WAND RF STERILE (DRAPES) IMPLANT
GAUZE 4X4 16PLY RFD (DISPOSABLE) ×3 IMPLANT
GAUZE PACKING FOLDED 2  STR (GAUZE/BANDAGES/DRESSINGS) ×2
GAUZE PACKING FOLDED 2 STR (GAUZE/BANDAGES/DRESSINGS) ×1 IMPLANT
GLOVE BIO SURGEON STRL SZ 6.5 (GLOVE) ×2 IMPLANT
GLOVE BIO SURGEONS STRL SZ 6.5 (GLOVE) ×1
GLOVE SURG ORTHO 8.0 STRL STRW (GLOVE) ×3 IMPLANT
GOWN STRL REUS W/ TWL LRG LVL3 (GOWN DISPOSABLE) ×1 IMPLANT
GOWN STRL REUS W/TWL 2XL LVL3 (GOWN DISPOSABLE) ×3 IMPLANT
GOWN STRL REUS W/TWL LRG LVL3 (GOWN DISPOSABLE) ×2
HEMOSTAT SURGICEL 2X14 (HEMOSTASIS) IMPLANT
KIT BASIN OR (CUSTOM PROCEDURE TRAY) ×3 IMPLANT
KIT TURNOVER KIT B (KITS) ×3 IMPLANT
MANIFOLD NEPTUNE WASTE (CANNULA) ×3 IMPLANT
NEEDLE BLUNT 16X1.5 OR ONLY (NEEDLE) IMPLANT
NEEDLE DENTAL 27 LONG (NEEDLE) ×3 IMPLANT
NS IRRIG 1000ML POUR BTL (IV SOLUTION) ×3 IMPLANT
PACK EENT II TURBAN DRAPE (CUSTOM PROCEDURE TRAY) ×3 IMPLANT
PAD ARMBOARD 7.5X6 YLW CONV (MISCELLANEOUS) ×3 IMPLANT
SPONGE SURGIFOAM ABS GEL 100 (HEMOSTASIS) IMPLANT
SPONGE SURGIFOAM ABS GEL 12-7 (HEMOSTASIS) IMPLANT
SPONGE SURGIFOAM ABS GEL SZ50 (HEMOSTASIS) ×3 IMPLANT
SUCTION FRAZIER HANDLE 10FR (MISCELLANEOUS) ×2
SUCTION TUBE FRAZIER 10FR DISP (MISCELLANEOUS) ×1 IMPLANT
SUT CHROMIC 3 0 PS 2 (SUTURE) IMPLANT
SUT CHROMIC 4 0 P 3 18 (SUTURE) ×3 IMPLANT
SYR 50ML SLIP (SYRINGE) ×3 IMPLANT
TOWEL NATURAL 10PK STERILE (DISPOSABLE) ×3 IMPLANT
TUBE CONNECTING 12'X1/4 (SUCTIONS) ×1
TUBE CONNECTING 12X1/4 (SUCTIONS) ×2 IMPLANT
WATER STERILE IRR 1000ML POUR (IV SOLUTION) ×3 IMPLANT
WATER TABLETS ICX (MISCELLANEOUS) IMPLANT
YANKAUER SUCT BULB TIP NO VENT (SUCTIONS) ×3 IMPLANT

## 2018-02-14 NOTE — Progress Notes (Signed)
Ref: Patient, No Pcp Per   Subjective:  Awake. No chest pain. Awaiting dental surgery. TEE with severe AS, mild to moderate MR and mild TR, PR and AR.  Objective:  Vital Signs in the last 24 hours: Temp:  [98.4 F (36.9 C)-98.7 F (37.1 C)] 98.7 F (37.1 C) (10/09 0744) Pulse Rate:  [74-85] 85 (10/09 0744) Cardiac Rhythm: Normal sinus rhythm (10/09 0701) Resp:  [10-18] 13 (10/09 0744) BP: (125-137)/(80-96) 125/88 (10/09 0744) SpO2:  [97 %-98 %] 97 % (10/09 0744) Weight:  [76.8 kg] 76.8 kg (10/09 0523)  Physical Exam: BP Readings from Last 1 Encounters:  02/14/18 125/88     Wt Readings from Last 1 Encounters:  02/14/18 76.8 kg    Weight change: -1.806 kg Body mass index is 23.61 kg/m. HEENT: [place]/AT, Eyes-Brown, PERL, EOMI, Conjunctiva-Pink, Sclera-Non-icteric Neck: No JVD, No bruit, Trachea midline. Lungs:  Clear, Bilateral. Cardiac:  Regular rhythm, normal S1 and S2, no S3. IV/VI systolic murmur. Abdomen:  Soft, non-tender. BS present. Extremities:  No edema present. No cyanosis. No clubbing. CNS: AxOx3, Cranial nerves grossly intact, moves all 4 extremities.  Skin: Warm and dry.   Intake/Output from previous day: 10/08 0701 - 10/09 0700 In: 573 [P.O.:840; I.V.:3] Out: -     Lab Results: BMET    Component Value Date/Time   NA 138 02/13/2018 0529   NA 139 02/12/2018 0403   K 4.0 02/13/2018 0529   K 3.9 02/12/2018 0403   CL 103 02/13/2018 0529   CL 103 02/12/2018 0403   CO2 27 02/13/2018 0529   CO2 25 02/12/2018 0403   GLUCOSE 103 (H) 02/13/2018 0529   GLUCOSE 99 02/12/2018 0403   BUN 21 02/13/2018 0529   BUN 23 02/12/2018 0403   CREATININE 1.39 (H) 02/13/2018 0529   CREATININE 1.27 (H) 02/12/2018 0403   CREATININE 1.31 (H) 02/11/2018 1812   CALCIUM 9.5 02/13/2018 0529   CALCIUM 9.1 02/12/2018 0403   GFRNONAA 48 (L) 02/13/2018 0529   GFRNONAA 54 (L) 02/12/2018 0403   GFRNONAA 52 (L) 02/11/2018 1812   GFRAA 56 (L) 02/13/2018 0529   GFRAA >60 02/12/2018  0403   GFRAA 60 (L) 02/11/2018 1812   CBC    Component Value Date/Time   WBC 9.9 02/13/2018 0529   RBC 5.19 02/13/2018 0529   HGB 16.4 02/13/2018 0529   HCT 49.7 02/13/2018 0529   PLT 208 02/13/2018 0529   MCV 95.8 02/13/2018 0529   MCH 31.6 02/13/2018 0529   MCHC 33.0 02/13/2018 0529   RDW 13.2 02/13/2018 0529   HEPATIC Function Panel No results for input(s): PROT in the last 8760 hours.  Invalid input(s):  ALBUMIN,  AST,  ALT,  ALKPHOS,  BILIDIR,  IBILI HEMOGLOBIN A1C No components found for: HGA1C,  MPG CARDIAC ENZYMES No results found for: CKTOTAL, CKMB, CKMBINDEX, TROPONINI BNP No results for input(s): PROBNP in the last 8760 hours. TSH No results for input(s): TSH in the last 8760 hours. CHOLESTEROL No results for input(s): CHOL in the last 8760 hours.  Scheduled Meds: . oxymetazoline      . [MAR Hold] pneumococcal 23 valent vaccine  0.5 mL Intramuscular Tomorrow-1000  . [MAR Hold] sodium chloride flush  3 mL Intravenous Q12H   Continuous Infusions: . [MAR Hold] sodium chloride    . sodium chloride 50 mL/hr at 02/14/18 0656   PRN Meds:.[MAR Hold] sodium chloride, 0.9 % irrigation (POUR BTL), [MAR Hold] acetaminophen, hemostatic agents, lidocaine-EPINEPHrine, [MAR Hold] ondansetron (ZOFRAN) IV, [MAR Hold] sodium  chloride flush  Assessment/Plan: Syncope Severe AS Mild AI, MR and TR CAD Hypertension Dental caries CKD, II  Awaiting dental surgery. IV fluids.   LOS: 3 days    Dixie Dials  MD  02/14/2018, 9:12 AM

## 2018-02-14 NOTE — Progress Notes (Signed)
Day of Surgery Procedure(s) (LRB): Extraction of tooth #'s 27 and 28 with alveoloplasty (N/A) Subjective: No complaints following dental extraction  Objective: Vital signs in last 24 hours: Temp:  [97.9 F (36.6 C)-98.7 F (37.1 C)] 98.3 F (36.8 C) (10/09 1200) Pulse Rate:  [73-91] 91 (10/09 1200) Cardiac Rhythm: Normal sinus rhythm (10/09 1011) Resp:  [10-20] 16 (10/09 1200) BP: (96-137)/(74-96) 110/76 (10/09 1200) SpO2:  [93 %-98 %] 93 % (10/09 1200) Weight:  [76.8 kg] 76.8 kg (10/09 0523)  Hemodynamic parameters for last 24 hours:    Intake/Output from previous day: 10/08 0701 - 10/09 0700 In: 409 [P.O.:840; I.V.:3] Out: -  Intake/Output this shift: Total I/O In: 400 [I.V.:400] Out: 10 [Blood:10]  General appearance: alert, cooperative and no distress Neurologic: intact Heart: regular rate and rhythm and + murmur Lungs: clear to auscultation bilaterally  Lab Results: Recent Labs    02/12/18 0403 02/13/18 0529  WBC 7.0 9.9  HGB 14.5 16.4  HCT 43.2 49.7  PLT 183 208   BMET:  Recent Labs    02/12/18 0403 02/13/18 0529  NA 139 138  K 3.9 4.0  CL 103 103  CO2 25 27  GLUCOSE 99 103*  BUN 23 21  CREATININE 1.27* 1.39*  CALCIUM 9.1 9.5    PT/INR:  Recent Labs    02/12/18 0403  LABPROT 13.7  INR 1.06   ABG    Component Value Date/Time   PHART 7.428 02/12/2018 0931   HCO3 25.8 02/12/2018 0931   TCO2 27 02/12/2018 0931   O2SAT 96.0 02/12/2018 0931   CBG (last 3)  No results for input(s): GLUCAP in the last 72 hours.  ECHOCARDIOGRAM Study Conclusions  - Left ventricle: There was mild concentric hypertrophy. Systolic   function was normal. The estimated ejection fraction was in the   range of 50% to 55%. Wall motion was normal; there were no   regional wall motion abnormalities. - Aortic valve: Calcification. Severe diffuse calcification   involving the right coronary, left coronary, and noncoronary   cusp. Cusp separation was severely  reduced. Transvalvular   velocity was increased, due to stenosis. There was severe   stenosis. Valve area by plenimetry was 0.89 cm2 and by mean   gradient of 65 mm Hg was 0.54 cm2. There was moderate   regurgitation directed centrally in the LVOT and eccentrically in   the LVOT. - Mitral valve: Mildly calcified annulus. There was mild to   moderate regurgitation, with multiple jets. - Left atrium: The atrium was mildly dilated. No evidence of   thrombus in the atrial cavity or appendage. - Right atrium: No evidence of thrombus in the atrial cavity or   appendage. - Atrial septum: No defect or patent foramen ovale was identified. I personally reviewed the echo images and concur with the findings noted above, although I think planimetry is significantly overestimating the valve area.   Assessment/Plan: 77 yo man with severe AS, mild-moderate MR and mild CAD involving a diagonal branch of the LAD. AVR indicated for survival benefit and relief of symptoms. He is potentially a candidate for either conventional AVR or TAVR. We discussed the relative advantages and disadvantages of each approach. I think conventional AVR is a better option given the asymmetric calcification of the valve and would favor that approach.   I discussed the general nature of the procedure, the need for general anesthesia, the incisions to be used and the use of cardiopulmonary bypass with Mr. Senna. We discussed the  expected hospital stay, overall recovery and short and long term outcomes. I informed him of the indications, risks, benefits and alternatives. He understands the risks include, but are not limited to death, stroke, MI, DVT/PE, bleeding, possible need for transfusion, infections, heart block requiring a pacemaker, cardiac arrhythmias, as well as other organ system dysfunction including respiratory, renal, or GI complications.   He accepts the risks and agrees to proceed.  Plan OR Friday   LOS: 3 days     [name] 02/14/2018

## 2018-02-14 NOTE — Progress Notes (Signed)
Patient back from OR, vital signs are stable, no bleeding noted from the mouth, patient denies pain, call bell within reach will monitor.

## 2018-02-14 NOTE — Anesthesia Procedure Notes (Signed)
Procedure Name: Intubation Date/Time: 02/14/2018 8:52 AM Performed by: Glynda Jaeger, CRNA Pre-anesthesia Checklist: Patient identified, Patient being monitored, Timeout performed, Emergency Drugs available and Suction available Patient Re-evaluated:Patient Re-evaluated prior to induction Oxygen Delivery Method: Circle System Utilized Preoxygenation: Pre-oxygenation with 100% oxygen Induction Type: IV induction Ventilation: Mask ventilation without difficulty Laryngoscope Size: Mac and 4 Grade View: Grade I Tube type: Oral Tube size: 7.5 mm Number of attempts: 1 Airway Equipment and Method: Stylet Placement Confirmation: ETT inserted through vocal cords under direct vision,  positive ETCO2 and breath sounds checked- equal and bilateral Secured at: 21 cm Tube secured with: Tape Dental Injury: Teeth and Oropharynx as per pre-operative assessment

## 2018-02-14 NOTE — Anesthesia Preprocedure Evaluation (Addendum)
Anesthesia Evaluation  Patient identified by MRN, date of birth, ID band Patient awake    Reviewed: Allergy & Precautions, H&P , NPO status , Patient's Chart, lab work & pertinent test results  Airway Mallampati: II  TM Distance: >3 FB Neck ROM: Full    Dental no notable dental hx. (+) Edentulous Upper, Partial Lower, Dental Advisory Given   Pulmonary neg pulmonary ROS,    Pulmonary exam normal breath sounds clear to auscultation       Cardiovascular hypertension, + Valvular Problems/Murmurs AS  Rhythm:Regular Rate:Normal + Systolic murmurs    Neuro/Psych negative neurological ROS  negative psych ROS   GI/Hepatic negative GI ROS, Neg liver ROS,   Endo/Other  negative endocrine ROS  Renal/GU negative Renal ROS  negative genitourinary   Musculoskeletal   Abdominal   Peds  Hematology negative hematology ROS (+)   Anesthesia Other Findings   Reproductive/Obstetrics negative OB ROS                            Anesthesia Physical Anesthesia Plan  ASA: IV  Anesthesia Plan: General   Post-op Pain Management:    Induction: Intravenous  PONV Risk Score and Plan: 3 and Ondansetron, Dexamethasone and Midazolam  Airway Management Planned: Oral ETT  Additional Equipment:   Intra-op Plan:   Post-operative Plan: Extubation in OR  Informed Consent: I have reviewed the patients History and Physical, chart, labs and discussed the procedure including the risks, benefits and alternatives for the proposed anesthesia with the patient or authorized representative who has indicated his/her understanding and acceptance.   Dental advisory given  Plan Discussed with: CRNA  Anesthesia Plan Comments:        Anesthesia Quick Evaluation

## 2018-02-14 NOTE — Op Note (Signed)
OPERATIVE REPORT  Patient:            Jon Ferguson Date of Birth:  Feb 15, 1941 MRN:                [id number]   DATE OF PROCEDURE:  02/14/2018  PREOPERATIVE DIAGNOSES: 1.  Severe aortic stenosis 2.  Pre-heart valve surgery dental protocol 3.  Dental caries 4.  Retained root segment 5.  Chronic periodontitis 6.  Loose teeth  POSTOPERATIVE DIAGNOSES: 1.  Severe aortic stenosis 2.  Pre-heart valve surgery dental protocol 3.  Dental caries 4.  Retained root segment 5.  Chronic periodontitis 6.  Loose teeth  OPERATIONS: 1. Multiple extraction of tooth numbers 27 and 28 with alveoloplasty  SURGEON: Lenn Cal, DDS  ASSISTANT: Molli Posey (dental assistant)  ANESTHESIA: General anesthesia via oral endotracheal tube.  MEDICATIONS: 1. Ancef 2 g IV prior to invasive dental procedures. 2. Local anesthesia with a total utilization of 1 carpule each containing 34 mg of lidocaine with 0.017 mg of epinephrine as well as 2 carpules each containing 9 mg of bupivacaine with 0.009 mg of epinephrine.  SPECIMENS: There are 2 teeth that were discarded.  DRAINS: None  CULTURES: None  COMPLICATIONS: None  ESTIMATED BLOOD LOSS: Less than 25 mLs.  INTRAVENOUS FLUIDS: 400 mLs of Normal Saline  INDICATIONS: The patient was recently diagnosed with severe aortic stenosis.  A medically necessary dental consultation was then requested to evaluate poor dentition prior to anticipated heart valve surgery.  The patient was examined and treatment planned for extraction of remaining teeth with alveoloplasty in the operating room with general anesthesia.  This treatment plan was formulated to decrease the risks and complications associated with dental infection from affecting the patient's systemic health and the anticipated heart valve surgery.  OPERATIVE FINDINGS: Patient was examined in operating room number 18.  The teeth were identified for extraction. The patient was noted be  affected by dental caries, retained root segment, chronic periodontitis, and accretions.    DESCRIPTION OF PROCEDURE:  Patient was brought to the main operating room number 18. Patient was then placed in the supine position on the operating table. General anesthesia was then induced per the anesthesia team. The patient was then prepped and draped in the usual manner for dental medicine procedure. A timeout was performed. The patient was identified and procedures were verified. A throat pack was placed at this time. The oral cavity was then thoroughly examined with the findings noted above. The patient was then ready for dental medicine procedure as follows:  Local anesthesia was then administered sequentially with a total utilization of one carpule each containing 34 mg of lidocaine with 0.017 mg of epinephrine as well as two carpules  each containing 9 mg bupivacaine with 0.009 mg of epinephrine.  At this point in time, the mandibular right quadrant was approached. The patient was given a mandibular right inferior alveolar nerve block and long buccal nerve block utilizing the bupivacaine with epinephrine. Further infiltration was then achieved utilizing the lidocaine with epinephrine. A 15 blade incision was then made from the distal of number of # 30 and extended to the mesial of #25.  A surgical flap was then carefully reflected.  Teeth were subluxated with a series straight elevators.  Tooth numbers 27 and 28 were then removed with a 151 forceps without complications. Alveoloplasty was then performed utilizing a rongeurs and bone file to help achieve primary closure. The tissues were approximated and trimmed appropriately. The surgical sites  were then irrigated with copious amounts of sterile saline.  A piece of Surgifoam was placed in the extraction socket.  The mandibular right surgical site was then closed from the distal of # 30 and extended to the mesial of #25 utilizing 3-0 chromic gut suture in a  continuous interrupted suture technique x1.   At this point in time, the entire mouth was irrigated with copious amounts of sterile saline. The patient was examined for complications, seeing none, the dental medicine procedure was deemed to be complete. The throat pack was removed at this time. An oral airway was then placed at the request of the anesthesia team. A series of 4 x 4 gauze were placed in the mouth to aid hemostasis. The patient was then handed over to the anesthesia team for final disposition. After an appropriate amount of time, the patient was extubated and taken to the postanesthsia care unit in good condition. All counts were correct for the dental medicine procedure.   Lenn Cal, DDS.

## 2018-02-14 NOTE — Progress Notes (Addendum)
PRE-OPERATIVE NOTE:  02/14/2018 Jon Ferguson [id number]  VITALS: BP 125/88 (BP Location: Right Arm)   Pulse 85   Temp 98.7 F (37.1 C) (Oral)   Resp 13   Ht 5\' 11"  (1.803 m)   Wt 76.8 kg Comment: Scale B  SpO2 97%   BMI 23.61 kg/m   Lab Results  Component Value Date   WBC 9.9 02/13/2018   HGB 16.4 02/13/2018   HCT 49.7 02/13/2018   MCV 95.8 02/13/2018   PLT 208 02/13/2018   BMET    Component Value Date/Time   NA 138 02/13/2018 0529   K 4.0 02/13/2018 0529   CL 103 02/13/2018 0529   CO2 27 02/13/2018 0529   GLUCOSE 103 (H) 02/13/2018 0529   BUN 21 02/13/2018 0529   CREATININE 1.39 (H) 02/13/2018 0529   CALCIUM 9.5 02/13/2018 0529   GFRNONAA 48 (L) 02/13/2018 0529   GFRAA 56 (L) 02/13/2018 0529    Lab Results  Component Value Date   INR 1.06 02/12/2018   No results found for: PTT   Jon Ferguson presents for multiple extractions (tooth 27 and 28) with alveoloplasty in the operating room.     SUBJECTIVE: The patient denies any acute medical or dental changes and agrees to proceed with treatment as planned.  EXAM: No sign of acute dental changes.  ASSESSMENT: Patient is affected by dental caries, retained root segment, chronic periodontitis, and incipient tooth mobility.  PLAN: Patient agrees to proceed with treatment as planned in the operating room as previously discussed and accepts the risks, benefits, and complications of the proposed treatment. Patient is aware of the risk for bleeding, bruising, swelling, infection, pain, nerve damage, soft tissue damage, root tip fracture, mandible fracture, and the risks of complications associated with the anesthesia. Patient also is aware of the potential for other complications not mentioned above.   Lenn Cal, DDS

## 2018-02-14 NOTE — Progress Notes (Addendum)
Initial Nutrition Assessment  DOCUMENTATION CODES:   Not applicable  INTERVENTION:    Boost Breeze po TID, each supplement provides 250 kcal and 9 grams of protein  NUTRITION DIAGNOSIS:   Increased nutrient needs related to post-op healing as evidenced by estimated needs  GOAL:   Patient will meet greater than or equal to 90% of their needs  MONITOR:   PO intake, Supplement acceptance, Labs, Weight trends, Skin, I & O's  REASON FOR ASSESSMENT:   Consult Other (Comment)(pt is edentulous)  ASSESSMENT:   77 yo Male who was transferred from [facility] in [place], [place] for severe aortic valve stenosis and syncope.  10/8 cardiac cath 10/9 multiple tooth extractions with alveoloplasty   RD spoke with patient at bedside. He is resting. Has ice pack on. Reports he was eating well PTA. Consuming 3 meals per day.  Labs & medications reviewed. C 1.39 (H). Denies unintentional weight loss. Having sx Friday, 10/11.  NUTRITION - FOCUSED PHYSICAL EXAM:  Completed. No muscle or fat loss depletions noticed.  Diet Order:   Diet Order            Diet clear liquid Room service appropriate? No; Fluid consistency: Thin  Diet effective now             EDUCATION NEEDS:   No education needs have been identified at this time  Skin:  Skin Assessment: Reviewed RN Assessment  Last BM:  10/7  Height:   Ht Readings from Last 1 Encounters:  02/11/18 5\' 11"  (1.803 m)   Weight:   Wt Readings from Last 1 Encounters:  02/14/18 76.8 kg   BMI:  Body mass index is 23.61 kg/m.  Estimated Nutritional Needs:   Kcal:  2000-2200  Protein:  100-115 gm  Fluid:  2.0-2.2 L  Arthur Holms, RD, LDN Pager #: [phone] After-Hours Pager #: [phone]

## 2018-02-14 NOTE — Anesthesia Postprocedure Evaluation (Signed)
Anesthesia Post Note  Patient: Jon Ferguson  Procedure(s) Performed: Extraction of tooth #'s 27 and 28 with alveoloplasty (N/A Mouth)     Patient location during evaluation: PACU Anesthesia Type: General Level of consciousness: awake and alert Pain management: pain level controlled Vital Signs Assessment: post-procedure vital signs reviewed and stable Respiratory status: spontaneous breathing, nonlabored ventilation and respiratory function stable Cardiovascular status: blood pressure returned to baseline and stable Postop Assessment: no apparent nausea or vomiting Anesthetic complications: no    Last Vitals:  Vitals:   02/14/18 1015 02/14/18 1030  BP: 99/85 105/76  Pulse: 78 73  Resp: 17 15  Temp:    SpO2: 95% 95%    Last Pain:  Vitals:   02/14/18 1011  TempSrc: Oral  PainSc:                  Tanishka Drolet,W. EDMOND

## 2018-02-14 NOTE — Transfer of Care (Signed)
Immediate Anesthesia Transfer of Care Note  Patient: Jon Ferguson  Procedure(s) Performed: Extraction of tooth #'s 27 and 28 with alveoloplasty (N/A Mouth)  Patient Location: PACU  Anesthesia Type:General  Level of Consciousness: awake, patient cooperative and responds to stimulation  Airway & Oxygen Therapy: Patient Spontanous Breathing and Patient connected to face mask oxygen  Post-op Assessment: Report given to RN, Post -op Vital signs reviewed and stable and Patient moving all extremities X 4  Post vital signs: Reviewed and stable  Last Vitals:  Vitals Value Taken Time  BP 118/78 02/14/2018  9:30 AM  Temp    Pulse 81 02/14/2018  9:31 AM  Resp 17 02/14/2018  9:31 AM  SpO2 98 % 02/14/2018  9:31 AM  Vitals shown include unvalidated device data.  Last Pain:  Vitals:   02/14/18 0853  TempSrc:   PainSc: 0-No pain         Complications: No apparent anesthesia complications

## 2018-02-15 ENCOUNTER — Encounter (HOSPITAL_COMMUNITY): Payer: Self-pay | Admitting: Dentistry

## 2018-02-15 LAB — BASIC METABOLIC PANEL
Anion gap: 6 (ref 5–15)
BUN: 19 mg/dL (ref 8–23)
CALCIUM: 8.9 mg/dL (ref 8.9–10.3)
CO2: 26 mmol/L (ref 22–32)
CREATININE: 1.28 mg/dL — AB (ref 0.61–1.24)
Chloride: 106 mmol/L (ref 98–111)
GFR calc non Af Amer: 53 mL/min — ABNORMAL LOW (ref >60)
GLUCOSE: 122 mg/dL — AB (ref 70–99)
Potassium: 5 mmol/L (ref 3.5–5.1)
SODIUM: 138 mmol/L (ref 135–145)

## 2018-02-15 LAB — URINALYSIS, ROUTINE W REFLEX MICROSCOPIC
Bilirubin Urine: NEGATIVE
Glucose, UA: NEGATIVE mg/dL
Ketones, ur: NEGATIVE mg/dL
Leukocytes, UA: NEGATIVE
NITRITE: NEGATIVE
Protein, ur: NEGATIVE mg/dL
Specific Gravity, Urine: 1.019 (ref 1.005–1.030)
pH: 5 (ref 5.0–8.0)

## 2018-02-15 LAB — CBC
HCT: 41.7 % (ref 39.0–52.0)
Hemoglobin: 13.6 g/dL (ref 13.0–17.0)
MCH: 31.3 pg (ref 26.0–34.0)
MCHC: 32.6 g/dL (ref 30.0–36.0)
MCV: 96.1 fL (ref 80.0–100.0)
Platelets: 192 K/uL (ref 150–400)
RBC: 4.34 MIL/uL (ref 4.22–5.81)
RDW: 13 % (ref 11.5–15.5)
WBC: 10.8 K/uL — ABNORMAL HIGH (ref 4.0–10.5)
nRBC: 0 % (ref 0.0–0.2)

## 2018-02-15 MED ORDER — EPINEPHRINE PF 1 MG/ML IJ SOLN
0.0000 ug/min | INTRAVENOUS | Status: DC
  Filled 2018-02-15: qty 4

## 2018-02-15 MED ORDER — INSULIN REGULAR(HUMAN) IN NACL 100-0.9 UT/100ML-% IV SOLN
INTRAVENOUS | Status: AC
  Administered 2018-02-16: .3 [IU]/h via INTRAVENOUS
  Filled 2018-02-15: qty 100

## 2018-02-15 MED ORDER — TRANEXAMIC ACID 1000 MG/10ML IV SOLN
1.5000 mg/kg/h | INTRAVENOUS | Status: AC
  Administered 2018-02-16: 1.5 mg/kg/h via INTRAVENOUS
  Filled 2018-02-15: qty 25

## 2018-02-15 MED ORDER — NOREPINEPHRINE 4 MG/250ML-% IV SOLN
0.0000 ug/min | INTRAVENOUS | Status: DC
  Filled 2018-02-15: qty 250

## 2018-02-15 MED ORDER — MAGNESIUM SULFATE 50 % IJ SOLN
40.0000 meq | INTRAMUSCULAR | Status: DC
  Filled 2018-02-15: qty 9.85

## 2018-02-15 MED ORDER — VANCOMYCIN HCL 10 G IV SOLR
1250.0000 mg | INTRAVENOUS | Status: AC
  Administered 2018-02-16: 1250 mg via INTRAVENOUS
  Filled 2018-02-15: qty 1250

## 2018-02-15 MED ORDER — TRANEXAMIC ACID (OHS) PUMP PRIME SOLUTION
2.0000 mg/kg | INTRAVENOUS | Status: DC
  Filled 2018-02-15: qty 1.55

## 2018-02-15 MED ORDER — SODIUM CHLORIDE 0.9 % IV SOLN
750.0000 mg | INTRAVENOUS | Status: DC
  Filled 2018-02-15: qty 750

## 2018-02-15 MED ORDER — CHLORHEXIDINE GLUCONATE CLOTH 2 % EX PADS
6.0000 | MEDICATED_PAD | Freq: Once | CUTANEOUS | Status: AC
  Administered 2018-02-16: 6 via TOPICAL

## 2018-02-15 MED ORDER — NITROGLYCERIN IN D5W 200-5 MCG/ML-% IV SOLN
2.0000 ug/min | INTRAVENOUS | Status: DC
  Filled 2018-02-15: qty 250

## 2018-02-15 MED ORDER — PHENYLEPHRINE HCL-NACL 20-0.9 MG/250ML-% IV SOLN
30.0000 ug/min | INTRAVENOUS | Status: AC
  Administered 2018-02-16: 10 ug/min via INTRAVENOUS
  Filled 2018-02-15: qty 250

## 2018-02-15 MED ORDER — SODIUM CHLORIDE 0.9 % IV SOLN
1.5000 g | INTRAVENOUS | Status: AC
  Administered 2018-02-16: 1.5 g via INTRAVENOUS
  Filled 2018-02-15: qty 1.5

## 2018-02-15 MED ORDER — DEXMEDETOMIDINE HCL IN NACL 400 MCG/100ML IV SOLN
0.1000 ug/kg/h | INTRAVENOUS | Status: AC
  Administered 2018-02-16: .4 ug/kg/h via INTRAVENOUS
  Filled 2018-02-15: qty 100

## 2018-02-15 MED ORDER — SODIUM CHLORIDE 0.9 % IV SOLN
INTRAVENOUS | Status: DC
  Filled 2018-02-15: qty 30

## 2018-02-15 MED ORDER — POTASSIUM CHLORIDE 2 MEQ/ML IV SOLN
80.0000 meq | INTRAVENOUS | Status: DC
  Filled 2018-02-15: qty 40

## 2018-02-15 MED ORDER — DOPAMINE-DEXTROSE 3.2-5 MG/ML-% IV SOLN
0.0000 ug/kg/min | INTRAVENOUS | Status: DC
  Filled 2018-02-15: qty 250

## 2018-02-15 MED ORDER — CHLORHEXIDINE GLUCONATE CLOTH 2 % EX PADS
6.0000 | MEDICATED_PAD | Freq: Once | CUTANEOUS | Status: AC
  Administered 2018-02-15: 6 via TOPICAL

## 2018-02-15 MED ORDER — CHLORHEXIDINE GLUCONATE 0.12 % MT SOLN
15.0000 mL | Freq: Once | OROMUCOSAL | Status: AC
  Administered 2018-02-16: 15 mL via OROMUCOSAL
  Filled 2018-02-15: qty 15

## 2018-02-15 MED ORDER — PLASMA-LYTE 148 IV SOLN
INTRAVENOUS | Status: DC
  Filled 2018-02-15: qty 2.5

## 2018-02-15 MED ORDER — TRANEXAMIC ACID (OHS) BOLUS VIA INFUSION
15.0000 mg/kg | INTRAVENOUS | Status: AC
  Administered 2018-02-16: 1164 mg via INTRAVENOUS
  Filled 2018-02-15: qty 1164

## 2018-02-15 MED ORDER — MILRINONE LACTATE IN DEXTROSE 20-5 MG/100ML-% IV SOLN
0.3000 ug/kg/min | INTRAVENOUS | Status: DC
  Filled 2018-02-15: qty 100

## 2018-02-15 NOTE — Progress Notes (Signed)
CARDIAC REHAB PHASE I   PRE:  Rate/Rhythm: 73 SR    BP: sitting 106/72    SaO2: 99 RA  MODE:  Ambulation: 430 ft   POST:  Rate/Rhythm: 86 SR with PVC    BP: sitting 110/78     SaO2: 100 RA  Tolerated well, independent, no c/o. Discussed sternal precautions, IS (2500 mL), mobility, and d/c planning. Pts wife can be with him at d/c. Gave materials and video to review preop.  1020-1100   [place], ACSM 02/15/2018 10:58 AM

## 2018-02-15 NOTE — Progress Notes (Signed)
POST OPERATIVE NOTE:  02/15/2018 Jon Ferguson [id number]  VITALS: BP 127/78 (BP Location: Right Arm)   Pulse 73   Temp 98.3 F (36.8 C) (Oral)   Resp 17   Ht 5\' 11"  (1.803 m)   Wt 77.6 kg   SpO2 98%   BMI 23.86 kg/m   LABS:  Lab Results  Component Value Date   WBC 10.8 (H) 02/15/2018   HGB 13.6 02/15/2018   HCT 41.7 02/15/2018   MCV 96.1 02/15/2018   PLT 192 02/15/2018   BMET    Component Value Date/Time   NA 138 02/15/2018 0447   K 5.0 02/15/2018 0447   CL 106 02/15/2018 0447   CO2 26 02/15/2018 0447   GLUCOSE 122 (H) 02/15/2018 0447   BUN 19 02/15/2018 0447   CREATININE 1.28 (H) 02/15/2018 0447   CALCIUM 8.9 02/15/2018 0447   GFRNONAA 53 (L) 02/15/2018 0447   GFRAA >60 02/15/2018 0447    Lab Results  Component Value Date   INR 1.06 02/12/2018   No results found for: PTT   Jon Ferguson is status post extraction of remaining teeth (27, 28) with alveoloplasty in the operating room with general anesthesia on 02/14/2018.  SUBJECTIVE: Patient with minimal complaints from dental extraction sites.  Patient denies any active bleeding.  EXAM: No sign of oral infection, heme, or ooze.  Sutures are intact.  Clots are present.  Intraoral swelling and bruising are noted.  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  Use chlorhexidine rinses twice daily after breakfast and at bedtime. 2.  Use salt water rinses as needed every 2 hours while awake in between the chlorhexidine rinses  3.  Advance diet as tolerated per dietary consultation. 4.  Follow-up with a dentist of his choice for fabrication of upper and lower complete dentures after adequate healing and once medically stable from the anticipated heart valve surgery.   5.  Patient is now cleared for heart valve  surgery tomorrow.   Lenn Cal, DDS

## 2018-02-15 NOTE — Progress Notes (Signed)
1 Day Post-Op Procedure(s) (LRB): Extraction of tooth #'s 27 and 28 with alveoloplasty (N/A) Subjective: No complaints  Objective: Vital signs in last 24 hours: Temp:  [97.9 F (36.6 C)-98.7 F (37.1 C)] 98.3 F (36.8 C) (10/10 0535) Pulse Rate:  [73-91] 73 (10/10 0535) Cardiac Rhythm: Heart block (10/10 0703) Resp:  [14-20] 17 (10/10 0535) BP: (96-127)/(74-85) 127/78 (10/10 0535) SpO2:  [93 %-98 %] 98 % (10/10 0535) Weight:  [77.6 kg] 77.6 kg (10/10 0535)  Hemodynamic parameters for last 24 hours:    Intake/Output from previous day: 10/09 0701 - 10/10 0700 In: 2203.5 [P.O.:840; I.V.:1363.5] Out: 1010 [Urine:1000; Blood:10] Intake/Output this shift: No intake/output data recorded.  General appearance: alert, cooperative and no distress Neurologic: intact Heart: regular rate and rhythm Lungs: clear to auscultation bilaterally  Lab Results: Recent Labs    02/13/18 0529 02/15/18 0447  WBC 9.9 10.8*  HGB 16.4 13.6  HCT 49.7 41.7  PLT 208 192   BMET:  Recent Labs    02/13/18 0529 02/15/18 0447  NA 138 138  K 4.0 5.0  CL 103 106  CO2 27 26  GLUCOSE 103* 122*  BUN 21 19  CREATININE 1.39* 1.28*  CALCIUM 9.5 8.9    PT/INR: No results for input(s): LABPROT, INR in the last 72 hours. ABG    Component Value Date/Time   PHART 7.428 02/12/2018 0931   HCO3 25.8 02/12/2018 0931   TCO2 27 02/12/2018 0931   O2SAT 96.0 02/12/2018 0931   CBG (last 3)  No results for input(s): GLUCAP in the last 72 hours.  Assessment/Plan: S/P Procedure(s) (LRB): Extraction of tooth #'s 27 and 28 with alveoloplasty (N/A) -Aortic stenosis For AVR- tissue valve in AM All questions answered   LOS: 4 days    [name] 02/15/2018

## 2018-02-15 NOTE — Progress Notes (Addendum)
Ref: Patient, No Pcp Per   Subjective:  No chest pain. Awaiting AV-tissue valve surgery tomorrow in AM. Offered anti-anxiety medication, patient refuses. Ambulating in room and in hall without symptoms.  Objective:  Vital Signs in the last 24 hours: Temp:  [98.2 F (36.8 C)-98.3 F (36.8 C)] 98.3 F (36.8 C) (10/10 0535) Pulse Rate:  [73-82] 73 (10/10 0535) Cardiac Rhythm: Heart block (10/10 0703) Resp:  [15-17] 17 (10/10 0535) BP: (107-127)/(77-78) 127/78 (10/10 0535) SpO2:  [97 %-98 %] 98 % (10/10 0535) Weight:  [77.6 kg] 77.6 kg (10/10 0535)  Physical Exam: BP Readings from Last 1 Encounters:  02/15/18 127/78     Wt Readings from Last 1 Encounters:  02/15/18 77.6 kg    Weight change: 0.806 kg Body mass index is 23.86 kg/m. HEENT: [place]/AT, Eyes-Blue, PERL, EOMI, Conjunctiva-Pink, Sclera-Non-icteric Neck: No JVD, No bruit, Trachea midline. Lungs:  Clear, Bilateral. Cardiac:  Regular rhythm, normal S1 and S2, no S3. IV/VI systolic and II/VI diastolic murmur. Abdomen:  Soft, non-tender. BS present. Extremities:  No edema present. No cyanosis. No clubbing. CNS: AxOx3, Cranial nerves grossly intact, moves all 4 extremities.  Skin: Warm and dry.   Intake/Output from previous day: 10/09 0701 - 10/10 0700 In: 2203.5 [P.O.:840; I.V.:1363.5] Out: 1010 [Urine:1000; Blood:10]    Lab Results: BMET    Component Value Date/Time   NA 138 02/15/2018 0447   NA 138 02/13/2018 0529   NA 139 02/12/2018 0403   K 5.0 02/15/2018 0447   K 4.0 02/13/2018 0529   K 3.9 02/12/2018 0403   CL 106 02/15/2018 0447   CL 103 02/13/2018 0529   CL 103 02/12/2018 0403   CO2 26 02/15/2018 0447   CO2 27 02/13/2018 0529   CO2 25 02/12/2018 0403   GLUCOSE 122 (H) 02/15/2018 0447   GLUCOSE 103 (H) 02/13/2018 0529   GLUCOSE 99 02/12/2018 0403   BUN 19 02/15/2018 0447   BUN 21 02/13/2018 0529   BUN 23 02/12/2018 0403   CREATININE 1.28 (H) 02/15/2018 0447   CREATININE 1.39 (H) 02/13/2018 0529    CREATININE 1.27 (H) 02/12/2018 0403   CALCIUM 8.9 02/15/2018 0447   CALCIUM 9.5 02/13/2018 0529   CALCIUM 9.1 02/12/2018 0403   GFRNONAA 53 (L) 02/15/2018 0447   GFRNONAA 48 (L) 02/13/2018 0529   GFRNONAA 54 (L) 02/12/2018 0403   GFRAA >60 02/15/2018 0447   GFRAA 56 (L) 02/13/2018 0529   GFRAA >60 02/12/2018 0403   CBC    Component Value Date/Time   WBC 10.8 (H) 02/15/2018 0447   RBC 4.34 02/15/2018 0447   HGB 13.6 02/15/2018 0447   HCT 41.7 02/15/2018 0447   PLT 192 02/15/2018 0447   MCV 96.1 02/15/2018 0447   MCH 31.3 02/15/2018 0447   MCHC 32.6 02/15/2018 0447   RDW 13.0 02/15/2018 0447   HEPATIC Function Panel No results for input(s): PROT in the last 8760 hours.  Invalid input(s):  ALBUMIN,  AST,  ALT,  ALKPHOS,  BILIDIR,  IBILI HEMOGLOBIN A1C No components found for: HGA1C,  MPG CARDIAC ENZYMES No results found for: CKTOTAL, CKMB, CKMBINDEX, TROPONINI BNP No results for input(s): PROBNP in the last 8760 hours. TSH No results for input(s): TSH in the last 8760 hours. CHOLESTEROL No results for input(s): CHOL in the last 8760 hours.  Scheduled Meds: . bisacodyl  5 mg Oral Once  . [START ON 02/16/2018] diazepam  5 mg Oral Once  . feeding supplement  1 Container Oral TID BM  . [  START ON 02/16/2018] heparin-papaverine-plasmalyte irrigation   Irrigation To OR  . [START ON 02/16/2018] insulin   Intravenous To OR  . [START ON 02/16/2018] magnesium sulfate  40 mEq Other To OR  . [START ON 02/16/2018] metoprolol tartrate  12.5 mg Oral Once  . [START ON 02/16/2018] phenylephrine  30-200 mcg/min Intravenous To OR  . pneumococcal 23 valent vaccine  0.5 mL Intramuscular Tomorrow-1000  . sodium chloride flush  3 mL Intravenous Q12H  . [START ON 02/16/2018] tranexamic acid  15 mg/kg Intravenous To OR  . [START ON 02/16/2018] tranexamic acid  2 mg/kg Intracatheter To OR   Continuous Infusions: . sodium chloride    . sodium chloride 50 mL/hr at 02/14/18 1046  . [START ON  02/16/2018] cefUROXime (ZINACEF)  IV    . [START ON 02/16/2018] cefUROXime (ZINACEF)  IV    . [START ON 02/16/2018] dexmedetomidine    . [START ON 02/16/2018] DOPamine    . [START ON 02/16/2018] epinephrine    . [START ON 02/16/2018] heparin 30,000 units/NS 1000 mL solution for CELLSAVER    . lactated ringers    . [START ON 02/16/2018] milrinone    . [START ON 02/16/2018] nitroGLYCERIN    . [START ON 02/16/2018] norepinephrine (LEVOPHED) Adult infusion    . [START ON 02/16/2018] tranexamic acid (CYKLOKAPRON) infusion ([facility])    . 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 AV surgery in AM.   LOS: 4 days    Dixie Dials  MD  02/15/2018, 5:21 PM

## 2018-02-16 ENCOUNTER — Encounter (HOSPITAL_COMMUNITY)
Admission: AD | Disposition: A | Discharge status: Home / Self Care | Payer: Self-pay | Source: Other Acute Inpatient Hospital | Attending: Thoracic Surgery (Cardiothoracic Vascular Surgery)

## 2018-02-16 ENCOUNTER — Inpatient Hospital Stay (HOSPITAL_COMMUNITY): Payer: Medicare Other | Admitting: Certified Registered Nurse Anesthetist

## 2018-02-16 ENCOUNTER — Encounter (HOSPITAL_COMMUNITY): Payer: Self-pay | Admitting: Certified Registered Nurse Anesthetist

## 2018-02-16 ENCOUNTER — Inpatient Hospital Stay (HOSPITAL_COMMUNITY): Payer: Medicare Other

## 2018-02-16 DIAGNOSIS — Z952 Presence of prosthetic heart valve: Secondary | ICD-10-CM

## 2018-02-16 DIAGNOSIS — I350 Nonrheumatic aortic (valve) stenosis: Secondary | ICD-10-CM

## 2018-02-16 HISTORY — PX: AORTIC VALVE REPLACEMENT: SHX41

## 2018-02-16 HISTORY — DX: Presence of prosthetic heart valve: Z95.2

## 2018-02-16 HISTORY — PX: TEE WITHOUT CARDIOVERSION: SHX5443

## 2018-02-16 LAB — CBC WITH DIFFERENTIAL/PLATELET
ABS IMMATURE GRANULOCYTES: 0.03 10*3/uL (ref 0.00–0.07)
ABS IMMATURE GRANULOCYTES: 0.04 10*3/uL (ref 0.00–0.07)
BASOS ABS: 0.1 10*3/uL (ref 0.0–0.1)
BASOS PCT: 0 %
Basophils Absolute: 0 10*3/uL (ref 0.0–0.1)
Basophils Relative: 1 %
EOS PCT: 4 %
Eosinophils Absolute: 0.3 10*3/uL (ref 0.0–0.5)
Eosinophils Absolute: 0 10*3/uL (ref 0.0–0.5)
Eosinophils Relative: 0 %
HCT: 39.6 % (ref 39.0–52.0)
HCT: 33.4 % — ABNORMAL LOW (ref 39.0–52.0)
HEMOGLOBIN: 12.2 g/dL — AB (ref 13.0–17.0)
Hemoglobin: 10.7 g/dL — ABNORMAL LOW (ref 13.0–17.0)
IMMATURE GRANULOCYTES: 0 %
Immature Granulocytes: 0 %
LYMPHS ABS: 0.6 10*3/uL — AB (ref 0.7–4.0)
LYMPHS PCT: 18 %
LYMPHS PCT: 6 %
Lymphs Abs: 1.6 10*3/uL (ref 0.7–4.0)
MCH: 30.1 pg (ref 26.0–34.0)
MCH: 31.3 pg (ref 26.0–34.0)
MCHC: 30.8 g/dL (ref 30.0–36.0)
MCHC: 32 g/dL (ref 30.0–36.0)
MCV: 97.8 fL (ref 80.0–100.0)
MCV: 97.7 fL (ref 80.0–100.0)
MONO ABS: 0.7 10*3/uL (ref 0.1–1.0)
MONO ABS: 0.6 10*3/uL (ref 0.1–1.0)
MONOS PCT: 6 %
Monocytes Relative: 7 %
NEUTROS ABS: 7.9 10*3/uL — AB (ref 1.7–7.7)
NEUTROS PCT: 88 %
Neutro Abs: 6.4 10*3/uL (ref 1.7–7.7)
Neutrophils Relative %: 70 %
PLATELETS: 126 10*3/uL — AB (ref 150–400)
Platelets: 182 10*3/uL (ref 150–400)
RBC: 4.05 MIL/uL — AB (ref 4.22–5.81)
RBC: 3.42 MIL/uL — ABNORMAL LOW (ref 4.22–5.81)
RDW: 13.2 % (ref 11.5–15.5)
RDW: 13.2 % (ref 11.5–15.5)
WBC: 9.2 10*3/uL (ref 4.0–10.5)
WBC: 9.2 10*3/uL (ref 4.0–10.5)
nRBC: 0 % (ref 0.0–0.2)
nRBC: 0 % (ref 0.0–0.2)

## 2018-02-16 LAB — POCT I-STAT 3, ART BLOOD GAS (G3+)
ACID-BASE DEFICIT: 1 mmol/L (ref 0.0–2.0)
Acid-base deficit: 1 mmol/L (ref 0.0–2.0)
Acid-base deficit: 3 mmol/L — ABNORMAL HIGH (ref 0.0–2.0)
Acid-base deficit: 3 mmol/L — ABNORMAL HIGH (ref 0.0–2.0)
BICARBONATE: 24.1 mmol/L (ref 20.0–28.0)
BICARBONATE: 22.5 mmol/L (ref 20.0–28.0)
Bicarbonate: 24.9 mmol/L (ref 20.0–28.0)
Bicarbonate: 23.4 mmol/L (ref 20.0–28.0)
Bicarbonate: 22.7 mmol/L (ref 20.0–28.0)
O2 SAT: 100 %
O2 SAT: 99 %
O2 SAT: 99 %
O2 SAT: 98 %
O2 Saturation: 100 %
PCO2 ART: 39.8 mmHg (ref 32.0–48.0)
PCO2 ART: 39.1 mmHg (ref 32.0–48.0)
PH ART: 7.361 (ref 7.350–7.450)
PH ART: 7.331 — AB (ref 7.350–7.450)
PO2 ART: 361 mmHg — AB (ref 83.0–108.0)
PO2 ART: 139 mmHg — AB (ref 83.0–108.0)
TCO2: 25 mmol/L (ref 22–32)
TCO2: 26 mmol/L (ref 22–32)
TCO2: 24 mmol/L (ref 22–32)
TCO2: 25 mmol/L (ref 22–32)
TCO2: 24 mmol/L (ref 22–32)
pCO2 arterial: 40.1 mmHg (ref 32.0–48.0)
pCO2 arterial: 38.5 mmHg (ref 32.0–48.0)
pCO2 arterial: 43.1 mmHg (ref 32.0–48.0)
pH, Arterial: 7.386 (ref 7.350–7.450)
pH, Arterial: 7.405 (ref 7.350–7.450)
pH, Arterial: 7.393 (ref 7.350–7.450)
pO2, Arterial: 378 mmHg — ABNORMAL HIGH (ref 83.0–108.0)
pO2, Arterial: 114 mmHg — ABNORMAL HIGH (ref 83.0–108.0)
pO2, Arterial: 106 mmHg (ref 83.0–108.0)

## 2018-02-16 LAB — GLUCOSE, CAPILLARY
GLUCOSE-CAPILLARY: 104 mg/dL — AB (ref 70–99)
Glucose-Capillary: 106 mg/dL — ABNORMAL HIGH (ref 70–99)
Glucose-Capillary: 127 mg/dL — ABNORMAL HIGH (ref 70–99)
Glucose-Capillary: 128 mg/dL — ABNORMAL HIGH (ref 70–99)
Glucose-Capillary: 137 mg/dL — ABNORMAL HIGH (ref 70–99)
Glucose-Capillary: 83 mg/dL (ref 70–99)

## 2018-02-16 LAB — CBC
HEMATOCRIT: 32.7 % — AB (ref 39.0–52.0)
Hemoglobin: 10.3 g/dL — ABNORMAL LOW (ref 13.0–17.0)
MCH: 31 pg (ref 26.0–34.0)
MCHC: 31.5 g/dL (ref 30.0–36.0)
MCV: 98.5 fL (ref 80.0–100.0)
NRBC: 0 % (ref 0.0–0.2)
PLATELETS: 107 10*3/uL — AB (ref 150–400)
RBC: 3.32 MIL/uL — ABNORMAL LOW (ref 4.22–5.81)
RDW: 13.1 % (ref 11.5–15.5)
WBC: 9.8 10*3/uL (ref 4.0–10.5)

## 2018-02-16 LAB — POCT I-STAT, CHEM 8
BUN: 24 mg/dL — ABNORMAL HIGH (ref 8–23)
BUN: 21 mg/dL (ref 8–23)
BUN: 23 mg/dL (ref 8–23)
BUN: 24 mg/dL — AB (ref 8–23)
BUN: 21 mg/dL (ref 8–23)
CALCIUM ION: 1.06 mmol/L — AB (ref 1.15–1.40)
CALCIUM ION: 1.07 mmol/L — AB (ref 1.15–1.40)
CALCIUM ION: 1.17 mmol/L (ref 1.15–1.40)
CHLORIDE: 106 mmol/L (ref 98–111)
CHLORIDE: 106 mmol/L (ref 98–111)
CHLORIDE: 104 mmol/L (ref 98–111)
CREATININE: 1 mg/dL (ref 0.61–1.24)
CREATININE: 0.8 mg/dL (ref 0.61–1.24)
CREATININE: 0.9 mg/dL (ref 0.61–1.24)
Calcium, Ion: 1.19 mmol/L (ref 1.15–1.40)
Calcium, Ion: 1.02 mmol/L — ABNORMAL LOW (ref 1.15–1.40)
Chloride: 104 mmol/L (ref 98–111)
Chloride: 105 mmol/L (ref 98–111)
Creatinine, Ser: 1 mg/dL (ref 0.61–1.24)
Creatinine, Ser: 0.9 mg/dL (ref 0.61–1.24)
GLUCOSE: 90 mg/dL (ref 70–99)
GLUCOSE: 89 mg/dL (ref 70–99)
GLUCOSE: 126 mg/dL — AB (ref 70–99)
GLUCOSE: 99 mg/dL (ref 70–99)
GLUCOSE: 129 mg/dL — AB (ref 70–99)
HCT: 26 % — ABNORMAL LOW (ref 39.0–52.0)
HCT: 26 % — ABNORMAL LOW (ref 39.0–52.0)
HCT: 25 % — ABNORMAL LOW (ref 39.0–52.0)
HCT: 30 % — ABNORMAL LOW (ref 39.0–52.0)
HEMATOCRIT: 32 % — AB (ref 39.0–52.0)
HEMOGLOBIN: 8.8 g/dL — AB (ref 13.0–17.0)
HEMOGLOBIN: 10.2 g/dL — AB (ref 13.0–17.0)
Hemoglobin: 10.9 g/dL — ABNORMAL LOW (ref 13.0–17.0)
Hemoglobin: 8.8 g/dL — ABNORMAL LOW (ref 13.0–17.0)
Hemoglobin: 8.5 g/dL — ABNORMAL LOW (ref 13.0–17.0)
POTASSIUM: 3.9 mmol/L (ref 3.5–5.1)
POTASSIUM: 3.9 mmol/L (ref 3.5–5.1)
POTASSIUM: 5.4 mmol/L — AB (ref 3.5–5.1)
POTASSIUM: 4.4 mmol/L (ref 3.5–5.1)
Potassium: 4.8 mmol/L (ref 3.5–5.1)
SODIUM: 138 mmol/L (ref 135–145)
Sodium: 140 mmol/L (ref 135–145)
Sodium: 139 mmol/L (ref 135–145)
Sodium: 135 mmol/L (ref 135–145)
Sodium: 137 mmol/L (ref 135–145)
TCO2: 26 mmol/L (ref 22–32)
TCO2: 26 mmol/L (ref 22–32)
TCO2: 27 mmol/L (ref 22–32)
TCO2: 25 mmol/L (ref 22–32)
TCO2: 23 mmol/L (ref 22–32)

## 2018-02-16 LAB — COMPREHENSIVE METABOLIC PANEL
ALBUMIN: 3 g/dL — AB (ref 3.5–5.0)
ALK PHOS: 32 U/L — AB (ref 38–126)
ALT: 27 U/L (ref 0–44)
AST: 24 U/L (ref 15–41)
Anion gap: 7 (ref 5–15)
BILIRUBIN TOTAL: 0.5 mg/dL (ref 0.3–1.2)
BUN: 26 mg/dL — ABNORMAL HIGH (ref 8–23)
CALCIUM: 8.3 mg/dL — AB (ref 8.9–10.3)
CO2: 23 mmol/L (ref 22–32)
CREATININE: 1.28 mg/dL — AB (ref 0.61–1.24)
Chloride: 108 mmol/L (ref 98–111)
GFR calc Af Amer: >60 mL/min (ref >60)
GFR calc non Af Amer: 53 mL/min — ABNORMAL LOW (ref >60)
Glucose, Bld: 101 mg/dL — ABNORMAL HIGH (ref 70–99)
Potassium: 3.7 mmol/L (ref 3.5–5.1)
SODIUM: 138 mmol/L (ref 135–145)
TOTAL PROTEIN: 5.7 g/dL — AB (ref 6.5–8.1)

## 2018-02-16 LAB — PROTIME-INR
INR: 1.48
Prothrombin Time: 17.8 seconds — ABNORMAL HIGH (ref 11.4–15.2)

## 2018-02-16 LAB — CREATININE, SERUM
Creatinine, Ser: 1.01 mg/dL (ref 0.61–1.24)
GFR calc Af Amer: >60 mL/min (ref >60)

## 2018-02-16 LAB — POCT I-STAT 4, (NA,K, GLUC, HGB,HCT)
GLUCOSE: 101 mg/dL — AB (ref 70–99)
HCT: 26 % — ABNORMAL LOW (ref 39.0–52.0)
Hemoglobin: 8.8 g/dL — ABNORMAL LOW (ref 13.0–17.0)
POTASSIUM: 4.4 mmol/L (ref 3.5–5.1)
Sodium: 139 mmol/L (ref 135–145)

## 2018-02-16 LAB — PLATELET COUNT: Platelets: 133 10*3/uL — ABNORMAL LOW (ref 150–400)

## 2018-02-16 LAB — APTT
aPTT: 31 seconds (ref 24–36)
aPTT: 38 seconds — ABNORMAL HIGH (ref 24–36)

## 2018-02-16 LAB — HEMOGLOBIN AND HEMATOCRIT, BLOOD
HEMATOCRIT: 29.5 % — AB (ref 39.0–52.0)
Hemoglobin: 9.7 g/dL — ABNORMAL LOW (ref 13.0–17.0)

## 2018-02-16 LAB — MAGNESIUM: Magnesium: 3.2 mg/dL — ABNORMAL HIGH (ref 1.7–2.4)

## 2018-02-16 SURGERY — REPLACEMENT, AORTIC VALVE, OPEN
Anesthesia: General | Site: Chest

## 2018-02-16 MED ORDER — CALCIUM CHLORIDE 10 % IV SOLN
INTRAVENOUS | Status: DC | PRN
  Administered 2018-02-16: 200 mg via INTRAVENOUS

## 2018-02-16 MED ORDER — ACETAMINOPHEN 500 MG PO TABS
1000.0000 mg | ORAL_TABLET | Freq: Four times a day (QID) | ORAL | Status: DC
  Administered 2018-02-16 – 2018-02-18 (×6): 1000 mg via ORAL
  Filled 2018-02-16 (×7): qty 2

## 2018-02-16 MED ORDER — BISACODYL 10 MG RE SUPP: 10.0000 mg | Freq: Every day | RECTAL | Status: DC

## 2018-02-16 MED ORDER — LACTATED RINGERS IV SOLN
INTRAVENOUS | Status: DC | PRN
  Administered 2018-02-16: 07:00:00 via INTRAVENOUS

## 2018-02-16 MED ORDER — ALBUMIN HUMAN 5 % IV SOLN
250.0000 mL | INTRAVENOUS | Status: AC | PRN
  Administered 2018-02-16 (×4): 12.5 g via INTRAVENOUS
  Filled 2018-02-16 (×2): qty 250

## 2018-02-16 MED ORDER — MIDAZOLAM HCL 5 MG/5ML IJ SOLN
INTRAMUSCULAR | Status: DC | PRN
  Administered 2018-02-16: 3 mg via INTRAVENOUS
  Administered 2018-02-16 (×2): 1 mg via INTRAVENOUS

## 2018-02-16 MED ORDER — ONDANSETRON HCL 4 MG/2ML IJ SOLN
4.0000 mg | Freq: Four times a day (QID) | INTRAMUSCULAR | Status: DC | PRN
  Administered 2018-02-17: 4 mg via INTRAVENOUS
  Filled 2018-02-16: qty 2

## 2018-02-16 MED ORDER — ACETAMINOPHEN 650 MG RE SUPP
650.0000 mg | Freq: Once | RECTAL | Status: AC
  Administered 2018-02-16: 650 mg via RECTAL
  Filled 2018-02-16: qty 1

## 2018-02-16 MED ORDER — FENTANYL CITRATE (PF) 250 MCG/5ML IJ SOLN
INTRAMUSCULAR | Status: AC
  Filled 2018-02-16: qty 10

## 2018-02-16 MED ORDER — FENTANYL CITRATE (PF) 250 MCG/5ML IJ SOLN
INTRAMUSCULAR | Status: DC | PRN
  Administered 2018-02-16: 200 ug via INTRAVENOUS
  Administered 2018-02-16: 150 ug via INTRAVENOUS
  Administered 2018-02-16 (×2): 100 ug via INTRAVENOUS
  Administered 2018-02-16 (×2): 50 ug via INTRAVENOUS
  Administered 2018-02-16: 250 ug via INTRAVENOUS

## 2018-02-16 MED ORDER — PROTAMINE SULFATE 10 MG/ML IV SOLN
INTRAVENOUS | Status: DC | PRN
  Administered 2018-02-16: 50 mg via INTRAVENOUS
  Administered 2018-02-16: 160 mg via INTRAVENOUS
  Administered 2018-02-16: 70 mg via INTRAVENOUS

## 2018-02-16 MED ORDER — TRAMADOL HCL 50 MG PO TABS: 50.0000 mg | ORAL_TABLET | ORAL | Status: DC | PRN

## 2018-02-16 MED ORDER — METOPROLOL TARTRATE 25 MG/10 ML ORAL SUSPENSION: 12.5000 mg | Freq: Two times a day (BID) | ORAL | Status: DC

## 2018-02-16 MED ORDER — PHENYLEPHRINE 40 MCG/ML (10ML) SYRINGE FOR IV PUSH (FOR BLOOD PRESSURE SUPPORT)
PREFILLED_SYRINGE | INTRAVENOUS | Status: AC
  Filled 2018-02-16: qty 10

## 2018-02-16 MED ORDER — INSULIN REGULAR(HUMAN) IN NACL 100-0.9 UT/100ML-% IV SOLN
INTRAVENOUS | Status: DC
  Administered 2018-02-16: 100 [IU] via INTRAVENOUS

## 2018-02-16 MED ORDER — MORPHINE SULFATE (PF) 2 MG/ML IV SOLN
2.0000 mg | INTRAVENOUS | Status: DC | PRN
  Administered 2018-02-16: 2 mg via INTRAVENOUS

## 2018-02-16 MED ORDER — MIDAZOLAM HCL 2 MG/2ML IJ SOLN: 2.0000 mg | INTRAMUSCULAR | Status: DC | PRN

## 2018-02-16 MED ORDER — MAGNESIUM SULFATE 4 GM/100ML IV SOLN
4.0000 g | Freq: Once | INTRAVENOUS | Status: AC
  Administered 2018-02-16: 4 g via INTRAVENOUS
  Filled 2018-02-16: qty 100

## 2018-02-16 MED ORDER — HEPARIN SODIUM (PORCINE) 1000 UNIT/ML IJ SOLN
INTRAMUSCULAR | Status: DC | PRN
  Administered 2018-02-16: 28000 [IU] via INTRAVENOUS
  Administered 2018-02-16: 5000 [IU] via INTRAVENOUS

## 2018-02-16 MED ORDER — VANCOMYCIN HCL IN DEXTROSE 1-5 GM/200ML-% IV SOLN
1000.0000 mg | Freq: Once | INTRAVENOUS | Status: AC
  Administered 2018-02-16: 1000 mg via INTRAVENOUS
  Filled 2018-02-16: qty 200

## 2018-02-16 MED ORDER — HEMOSTATIC AGENTS (NO CHARGE) OPTIME
TOPICAL | Status: DC | PRN
  Administered 2018-02-16: 3 via TOPICAL
  Administered 2018-02-16: 1 via TOPICAL

## 2018-02-16 MED ORDER — PANTOPRAZOLE SODIUM 40 MG PO TBEC
40.0000 mg | DELAYED_RELEASE_TABLET | Freq: Every day | ORAL | Status: DC
  Administered 2018-02-18: 40 mg via ORAL
  Filled 2018-02-16: qty 1

## 2018-02-16 MED ORDER — LACTATED RINGERS IV SOLN
INTRAVENOUS | Status: DC | PRN
  Administered 2018-02-16 (×3): via INTRAVENOUS

## 2018-02-16 MED ORDER — ROCURONIUM BROMIDE 50 MG/5ML IV SOSY
PREFILLED_SYRINGE | INTRAVENOUS | Status: AC
  Filled 2018-02-16: qty 10

## 2018-02-16 MED ORDER — 0.9 % SODIUM CHLORIDE (POUR BTL) OPTIME
TOPICAL | Status: DC | PRN
  Administered 2018-02-16: 5000 mL

## 2018-02-16 MED ORDER — BISACODYL 5 MG PO TBEC
10.0000 mg | DELAYED_RELEASE_TABLET | Freq: Every day | ORAL | Status: DC
  Administered 2018-02-17 – 2018-02-18 (×2): 10 mg via ORAL
  Filled 2018-02-16 (×2): qty 2

## 2018-02-16 MED ORDER — MIDAZOLAM HCL 10 MG/2ML IJ SOLN
INTRAMUSCULAR | Status: AC
  Filled 2018-02-16: qty 2

## 2018-02-16 MED ORDER — CHLORHEXIDINE GLUCONATE 0.12 % MT SOLN
15.0000 mL | OROMUCOSAL | Status: AC
  Administered 2018-02-16: 15 mL via OROMUCOSAL

## 2018-02-16 MED ORDER — LACTATED RINGERS IV SOLN
INTRAVENOUS | Status: DC
  Administered 2018-02-16: 12:00:00 via INTRAVENOUS

## 2018-02-16 MED ORDER — ASPIRIN EC 325 MG PO TBEC
325.0000 mg | DELAYED_RELEASE_TABLET | Freq: Every day | ORAL | Status: DC
  Administered 2018-02-17 – 2018-02-18 (×2): 325 mg via ORAL
  Filled 2018-02-16 (×2): qty 1

## 2018-02-16 MED ORDER — OXYCODONE HCL 5 MG PO TABS
5.0000 mg | ORAL_TABLET | ORAL | Status: DC | PRN
  Administered 2018-02-16 (×2): 5 mg via ORAL
  Administered 2018-02-17 (×2): 10 mg via ORAL
  Filled 2018-02-16: qty 2
  Filled 2018-02-16: qty 1
  Filled 2018-02-16: qty 2
  Filled 2018-02-16: qty 1

## 2018-02-16 MED ORDER — DOCUSATE SODIUM 100 MG PO CAPS
200.0000 mg | ORAL_CAPSULE | Freq: Every day | ORAL | Status: DC
  Administered 2018-02-17 – 2018-02-18 (×2): 200 mg via ORAL
  Filled 2018-02-16 (×2): qty 2

## 2018-02-16 MED ORDER — PROTAMINE SULFATE 10 MG/ML IV SOLN
INTRAVENOUS | Status: AC
  Filled 2018-02-16: qty 25

## 2018-02-16 MED ORDER — SODIUM CHLORIDE 0.9% FLUSH: 3.0000 mL | INTRAVENOUS | Status: DC | PRN

## 2018-02-16 MED ORDER — SODIUM CHLORIDE 0.9 % IV SOLN: 250.0000 mL | INTRAVENOUS | Status: DC

## 2018-02-16 MED ORDER — METOPROLOL TARTRATE 12.5 MG HALF TABLET
12.5000 mg | ORAL_TABLET | Freq: Two times a day (BID) | ORAL | Status: DC
  Filled 2018-02-16: qty 1

## 2018-02-16 MED ORDER — LACTATED RINGERS IV SOLN: INTRAVENOUS | Status: DC | PRN

## 2018-02-16 MED ORDER — DEXMEDETOMIDINE HCL IN NACL 400 MCG/100ML IV SOLN
0.0000 ug/kg/h | INTRAVENOUS | Status: DC
  Administered 2018-02-16: 0.05 ug/kg/h via INTRAVENOUS

## 2018-02-16 MED ORDER — ACETAMINOPHEN 160 MG/5ML PO SOLN: 650.0000 mg | Freq: Once | ORAL | Status: AC

## 2018-02-16 MED ORDER — HEPARIN SODIUM (PORCINE) 1000 UNIT/ML IJ SOLN
INTRAMUSCULAR | Status: AC
  Filled 2018-02-16: qty 1

## 2018-02-16 MED ORDER — SODIUM CHLORIDE 0.9% FLUSH
3.0000 mL | Freq: Two times a day (BID) | INTRAVENOUS | Status: DC
  Administered 2018-02-17 (×2): 3 mL via INTRAVENOUS

## 2018-02-16 MED ORDER — POTASSIUM CHLORIDE 10 MEQ/50ML IV SOLN: 10.0000 meq | INTRAVENOUS | Status: AC

## 2018-02-16 MED ORDER — METOPROLOL TARTRATE 5 MG/5ML IV SOLN
2.5000 mg | INTRAVENOUS | Status: DC | PRN
  Administered 2018-02-17: 5 mg via INTRAVENOUS
  Administered 2018-02-17: 2.5 mg via INTRAVENOUS
  Administered 2018-02-17 – 2018-02-18 (×3): 5 mg via INTRAVENOUS
  Filled 2018-02-16 (×5): qty 5

## 2018-02-16 MED ORDER — ALBUMIN HUMAN 5 % IV SOLN
INTRAVENOUS | Status: DC | PRN
  Administered 2018-02-16 (×3): via INTRAVENOUS

## 2018-02-16 MED ORDER — INSULIN ASPART 100 UNIT/ML ~~LOC~~ SOLN
0.0000 [IU] | SUBCUTANEOUS | Status: DC
  Administered 2018-02-16 – 2018-02-17 (×5): 2 [IU] via SUBCUTANEOUS

## 2018-02-16 MED ORDER — PROPOFOL 10 MG/ML IV BOLUS
INTRAVENOUS | Status: AC
  Filled 2018-02-16: qty 20

## 2018-02-16 MED ORDER — FAMOTIDINE IN NACL 20-0.9 MG/50ML-% IV SOLN
20.0000 mg | Freq: Two times a day (BID) | INTRAVENOUS | Status: AC
  Administered 2018-02-16 (×2): 20 mg via INTRAVENOUS
  Filled 2018-02-16: qty 50

## 2018-02-16 MED ORDER — INSULIN REGULAR BOLUS VIA INFUSION
0.0000 [IU] | Freq: Three times a day (TID) | INTRAVENOUS | Status: DC
  Filled 2018-02-16: qty 10

## 2018-02-16 MED ORDER — PHENYLEPHRINE 40 MCG/ML (10ML) SYRINGE FOR IV PUSH (FOR BLOOD PRESSURE SUPPORT)
PREFILLED_SYRINGE | INTRAVENOUS | Status: DC | PRN
  Administered 2018-02-16 (×2): 40 ug via INTRAVENOUS
  Administered 2018-02-16 (×2): 80 ug via INTRAVENOUS

## 2018-02-16 MED ORDER — SODIUM CHLORIDE 0.9 % IV SOLN
INTRAVENOUS | Status: DC
  Administered 2018-02-16: 13:00:00 via INTRAVENOUS
  Administered 2018-02-17: 250 mL via INTRAVENOUS

## 2018-02-16 MED ORDER — MORPHINE SULFATE (PF) 2 MG/ML IV SOLN
1.0000 mg | INTRAVENOUS | Status: AC | PRN
  Administered 2018-02-16: 2 mg via INTRAVENOUS
  Filled 2018-02-16: qty 2

## 2018-02-16 MED ORDER — LACTATED RINGERS IV SOLN
500.0000 mL | Freq: Once | INTRAVENOUS | Status: AC | PRN
  Administered 2018-02-16: 500 mL via INTRAVENOUS

## 2018-02-16 MED ORDER — NITROGLYCERIN IN D5W 200-5 MCG/ML-% IV SOLN
0.0000 ug/min | INTRAVENOUS | Status: DC
  Administered 2018-02-16: 5 ug/min via INTRAVENOUS

## 2018-02-16 MED ORDER — LIDOCAINE 2% (20 MG/ML) 5 ML SYRINGE
INTRAMUSCULAR | Status: AC
  Filled 2018-02-16: qty 5

## 2018-02-16 MED ORDER — PROTAMINE SULFATE 10 MG/ML IV SOLN
INTRAVENOUS | Status: AC
  Filled 2018-02-16: qty 5

## 2018-02-16 MED ORDER — SODIUM CHLORIDE 0.9 % IV SOLN
1.5000 g | Freq: Two times a day (BID) | INTRAVENOUS | Status: AC
  Administered 2018-02-16 – 2018-02-18 (×4): 1.5 g via INTRAVENOUS
  Filled 2018-02-16 (×4): qty 1.5

## 2018-02-16 MED ORDER — SODIUM CHLORIDE 0.45 % IV SOLN
INTRAVENOUS | Status: DC | PRN
  Administered 2018-02-16: 13:00:00 via INTRAVENOUS

## 2018-02-16 MED ORDER — ACETAMINOPHEN 160 MG/5ML PO SOLN: 1000.0000 mg | Freq: Four times a day (QID) | ORAL | Status: DC

## 2018-02-16 MED ORDER — ROCURONIUM BROMIDE 10 MG/ML (PF) SYRINGE
PREFILLED_SYRINGE | INTRAVENOUS | Status: DC | PRN
  Administered 2018-02-16 (×3): 50 mg via INTRAVENOUS

## 2018-02-16 MED ORDER — ASPIRIN 81 MG PO CHEW: 324.0000 mg | CHEWABLE_TABLET | Freq: Every day | ORAL | Status: DC

## 2018-02-16 MED ORDER — LIDOCAINE 2% (20 MG/ML) 5 ML SYRINGE
INTRAMUSCULAR | Status: DC | PRN
  Administered 2018-02-16: 40 mg via INTRAVENOUS

## 2018-02-16 MED ORDER — LACTATED RINGERS IV SOLN: INTRAVENOUS | Status: DC

## 2018-02-16 MED ORDER — SODIUM CHLORIDE 0.9 % IV SOLN
INTRAVENOUS | Status: DC | PRN
  Administered 2018-02-16: 750 mg via INTRAVENOUS

## 2018-02-16 MED ORDER — PHENYLEPHRINE HCL-NACL 20-0.9 MG/250ML-% IV SOLN
0.0000 ug/min | INTRAVENOUS | Status: DC
  Administered 2018-02-16: 0 ug/min via INTRAVENOUS
  Filled 2018-02-16: qty 250

## 2018-02-16 MED ORDER — PROPOFOL 10 MG/ML IV BOLUS
INTRAVENOUS | Status: DC | PRN
  Administered 2018-02-16: 50 mg via INTRAVENOUS

## 2018-02-16 SURGICAL SUPPLY — 73 items
ADAPTER CARDIO PERF ANTE/RETRO (ADAPTER) ×4 IMPLANT
APPLICATOR COTTON TIP 6IN STRL (MISCELLANEOUS) IMPLANT
BAG DECANTER FOR FLEXI CONT (MISCELLANEOUS) ×4 IMPLANT
BLADE STERNUM SYSTEM 6 (BLADE) ×4 IMPLANT
BLADE SURG 15 STRL LF DISP TIS (BLADE) ×2 IMPLANT
BLADE SURG 15 STRL SS (BLADE) ×4
CANISTER SUCT 3000ML PPV (MISCELLANEOUS) ×4 IMPLANT
CANNULA EZ GLIDE AORTIC 21FR (CANNULA) ×4 IMPLANT
CANNULA GUNDRY RCSP 15FR (MISCELLANEOUS) ×4 IMPLANT
CATH CPB KIT HENDRICKSON (MISCELLANEOUS) ×4 IMPLANT
CATH HEART VENT LEFT (CATHETERS) ×2 IMPLANT
CATH ROBINSON RED A/P 18FR (CATHETERS) ×8 IMPLANT
CATH THORACIC 36FR RT ANG (CATHETERS) ×6 IMPLANT
CLIP FOGARTY SPRING 6M (CLIP) IMPLANT
CONT SPEC 4OZ CLIKSEAL STRL BL (MISCELLANEOUS) ×2 IMPLANT
COVER WAND RF STERILE (DRAPES) ×4 IMPLANT
CRADLE DONUT ADULT HEAD (MISCELLANEOUS) ×4 IMPLANT
DRAPE SLUSH/WARMER DISC (DRAPES) ×4 IMPLANT
DRSG COVADERM 4X14 (GAUZE/BANDAGES/DRESSINGS) ×4 IMPLANT
ELECT REM PT RETURN 9FT ADLT (ELECTROSURGICAL) ×8
ELECTRODE REM PT RTRN 9FT ADLT (ELECTROSURGICAL) ×4 IMPLANT
FELT TEFLON 1X6 (MISCELLANEOUS) ×8 IMPLANT
GAUZE SPONGE 4X4 12PLY STRL (GAUZE/BANDAGES/DRESSINGS) ×6 IMPLANT
GLOVE SURG SIGNA 7.5 PF LTX (GLOVE) ×12 IMPLANT
GOWN STRL REUS W/ TWL LRG LVL3 (GOWN DISPOSABLE) ×8 IMPLANT
GOWN STRL REUS W/ TWL XL LVL3 (GOWN DISPOSABLE) ×2 IMPLANT
GOWN STRL REUS W/TWL LRG LVL3 (GOWN DISPOSABLE) ×8
GOWN STRL REUS W/TWL XL LVL3 (GOWN DISPOSABLE) ×2
HEMOSTAT POWDER SURGIFOAM 1G (HEMOSTASIS) ×12 IMPLANT
HEMOSTAT SURGICEL 2X14 (HEMOSTASIS) ×4 IMPLANT
INSERT FOGARTY XLG (MISCELLANEOUS) IMPLANT
KIT BASIN OR (CUSTOM PROCEDURE TRAY) ×4 IMPLANT
KIT SUCTION CATH 14FR (SUCTIONS) ×8 IMPLANT
KIT TURNOVER KIT B (KITS) ×4 IMPLANT
LINE VENT (MISCELLANEOUS) ×2 IMPLANT
NEEDLE AORTIC AIR ASPIRATING (NEEDLE) ×2 IMPLANT
NS IRRIG 1000ML POUR BTL (IV SOLUTION) ×20 IMPLANT
PACK E OPEN HEART (SUTURE) ×4 IMPLANT
PACK OPEN HEART (CUSTOM PROCEDURE TRAY) ×4 IMPLANT
PAD ARMBOARD 7.5X6 YLW CONV (MISCELLANEOUS) ×8 IMPLANT
SET CARDIOPLEGIA MPS 5001102 (MISCELLANEOUS) ×2 IMPLANT
SUT BONE WAX W31G (SUTURE) ×4 IMPLANT
SUT ETHIBON 2 0 V 52N 30 (SUTURE) ×8 IMPLANT
SUT ETHIBON EXCEL 2-0 V-5 (SUTURE) IMPLANT
SUT ETHIBOND 2 0 SH (SUTURE) ×2
SUT ETHIBOND 2 0 SH 36X2 (SUTURE) ×2 IMPLANT
SUT ETHIBOND 2 0 V4 (SUTURE) IMPLANT
SUT ETHIBOND 2 0V4 GREEN (SUTURE) IMPLANT
SUT ETHIBOND 4 0 RB 1 (SUTURE) IMPLANT
SUT ETHIBOND V-5 VALVE (SUTURE) IMPLANT
SUT PROLENE 3 0 SH 1 (SUTURE) IMPLANT
SUT PROLENE 3 0 SH DA (SUTURE) ×4 IMPLANT
SUT PROLENE 4 0 RB 1 (SUTURE) ×10
SUT PROLENE 4-0 RB1 .5 CRCL 36 (SUTURE) ×4 IMPLANT
SUT PROLENE 7 0 BV 1 (SUTURE) ×2 IMPLANT
SUT SILK  1 MH (SUTURE) ×2
SUT SILK 1 MH (SUTURE) ×2 IMPLANT
SUT STEEL 6MS V (SUTURE) IMPLANT
SUT STEEL SZ 6 DBL 3X14 BALL (SUTURE) IMPLANT
SUT VIC AB 1 CTX 36 (SUTURE) ×4
SUT VIC AB 1 CTX36XBRD ANBCTR (SUTURE) ×4 IMPLANT
SUT VIC AB 2-0 CTX 27 (SUTURE) IMPLANT
SUT VIC AB 3-0 X1 27 (SUTURE) IMPLANT
SYR 10ML KIT SKIN ADHESIVE (MISCELLANEOUS) IMPLANT
SYSTEM SAHARA CHEST DRAIN ATS (WOUND CARE) ×4 IMPLANT
TAPE CLOTH SURG 4X10 WHT LF (GAUZE/BANDAGES/DRESSINGS) ×2 IMPLANT
TOWEL GREEN STERILE (TOWEL DISPOSABLE) ×4 IMPLANT
TOWEL GREEN STERILE FF (TOWEL DISPOSABLE) ×4 IMPLANT
TRAY FOLEY SLVR 16FR TEMP STAT (SET/KITS/TRAYS/PACK) ×4 IMPLANT
UNDERPAD 30X30 (UNDERPADS AND DIAPERS) ×4 IMPLANT
VALVE AORTIC SZ25 INSP/RESIL (Prosthesis & Implant Heart) ×2 IMPLANT
VENT LEFT HEART 12002 (CATHETERS) ×4
WATER STERILE IRR 1000ML POUR (IV SOLUTION) ×8 IMPLANT

## 2018-02-16 NOTE — Anesthesia Postprocedure Evaluation (Signed)
Anesthesia Post Note  Patient: Jon Ferguson  Procedure(s) Performed: AORTIC VALVE REPLACEMENT (AVR). 56mm EDWARDS LIFESCIENCE INSPIRIS VALVE. (N/A Chest) TRANSESOPHAGEAL ECHOCARDIOGRAM (TEE) (N/A )     Patient location during evaluation: SICU Anesthesia Type: General Level of consciousness: sedated Pain management: pain level controlled Vital Signs Assessment: post-procedure vital signs reviewed and stable Respiratory status: patient remains intubated per anesthesia plan Cardiovascular status: stable Postop Assessment: no apparent nausea or vomiting Anesthetic complications: no    Last Vitals:  Vitals:   02/16/18 1600 02/16/18 1642  BP: 110/81   Pulse:  79  Resp: 11 12  Temp: 37.2 C   SpO2: 100%     Last Pain:  Vitals:   02/16/18 1723  TempSrc:   PainSc: 8                  Fenix Ruppe COKER

## 2018-02-16 NOTE — Brief Op Note (Signed)
02/11/2018 - 02/16/2018  3:08 PM  PATIENT:  Jon Ferguson  77 y.o. male  PRE-OPERATIVE DIAGNOSIS:  SEVERE AS  POST-OPERATIVE DIAGNOSIS: SEVERE AS  PROCEDURE:  Procedure(s): AORTIC VALVE REPLACEMENT (AVR). 91mm EDWARDS LIFESCIENCE INSPIRIS VALVE. (N/A) TRANSESOPHAGEAL ECHOCARDIOGRAM (TEE) (N/A) 25 mm Edwards Inspiris Resilia Tissue valve (serial # [id number]) SURGEON:  Surgeon(s) and Role:    * [name], MD - Primary  PHYSICIAN ASSISTANT:  Nicholes Rough, PA-C   ANESTHESIA:   general   EBL: - 500 ml  BLOOD ADMINISTERED:none, cell saver  DRAINS: 2 Mediastinal CT  LOCAL MEDICATIONS USED:  NONE  SPECIMEN:  Source of Specimen:  AORTIC VALVE LEAFLETS  DISPOSITION OF SPECIMEN:  PATHOLOGY  COUNTS:  YES  DICTATION: .Dragon Dictation  PLAN OF CARE: Admit to inpatient   PATIENT DISPOSITION:  ICU - intubated and hemodynamically stable.   Delay start of Pharmacological VTE agent (>24hrs) due to surgical blood loss or risk of bleeding: yes

## 2018-02-16 NOTE — Op Note (Signed)
NAME: Jon Ferguson, Jon Ferguson MEDICAL RECORD [id number] ACCOUNT [id number] DATE OF BIRTH:[birth year] FACILITY: [facility] LOCATION: [facility] PHYSICIAN:Krystalyn Kubota Chaya Jan, MD  OPERATIVE REPORT  DATE OF PROCEDURE:  02/16/2018  PREOPERATIVE DIAGNOSIS:  Severe aortic stenosis.  POSTOPERATIVE DIAGNOSIS:  Severe aortic stenosis.  PROCEDURE:  Median sternotomy, extracorporeal circulation, Aortic valve replacement using an Edwards Inspiris Resilia pericardial aortic valve (model #11500A, serial number [id number]).  SURGEON:  Modesto Charon, MD  ASSISTANT:  Nicholes Rough, PA-C   ANESTHESIA:  General.  FINDINGS:  Severe aortic stenosis. 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 Severe annular calcification. Good function of the prosthetic valve with a 5 mm mean gradient post  replacement.  CLINICAL NOTE:  The patient is a 77 year old gentleman who presented with a syncopal episode.  He was brought to the hospital.  Echocardiogram showed severe aortic stenosis with a peak gradient of 80 mmHg.  On cardiac catheterization, he had some mild  disease in the ostium of a diagonal branch, but no other significant lesions.  He was advised to undergo aortic valve replacement.  The indications, risks, benefits, and alternatives were discussed in detail with the patient.  He understood and  accepted the risks and agreed to proceed.  OPERATIVE NOTE:  The patient was brought to the preoperative holding area on 02/16/2018.  Anesthesia placed a Swan-Ganz catheter and an arterial blood pressure monitoring line.  He was taken to the operating room, anesthetized and intubated.  A Foley  catheter was placed.  Intravenous antibiotics were administered.  Transesophageal echocardiography was performed by Dr. Roberts Gaudy.  Please refer to his separately dictated note for full details of the procedure, but briefly there was left ventricular   hypertrophy.  There was severe aortic stenosis with a mean gradient greater than 60.  There was mild MR and TR.  The chest, abdomen and legs were prepped and draped in the usual sterile fashion.  A timeout was performed.  A median sternotomy was performed.  Initial hemostasis was achieved.  A retractor was placed.  The patient was fully heparinized.  The pericardium was opened.  There was an unusual-appearing finding on the echocardiogram  adjacent to the left atrial appendage.  This turned out to be a fat pad on the posterior aspect of the pulmonary artery.  After confirming adequate anticoagulation with ACT measurement, the aorta was cannulated via concentric 2-0 Ethibond pledgeted  pursestring sutures.  A dual-stage venous cannula was placed via pursestring suture in the right atrial appendage.  Cardiopulmonary bypass was initiated.  Flows were maintained per protocol.  The patient was cooled to 28 degrees Celsius.  Carbon dioxide  was insufflated into the operative field.  A left ventricular vent was placed via pursestring suture in the right superior pulmonary vein.  A retrograde cardioplegia cannula was placed via pursestring suture in the right atrium and directed into the  coronary sinus.  A temperature probe was placed in the myocardial septum.  A foam pad was placed in the pericardium to insulate the heart, and a cardioplegia cannula was placed in the ascending aorta.  The aorta was cross clamped.  The left ventricle was emptied via the aortic root vent.  Cardiac arrest then was achieved with a combination of cold antegrade and retrograde blood cardioplegia and topical iced saline.  An initial 750 mL of cardioplegia  was administered antegrade.  There was a rapid diastolic arrest.  Additional cardioplegia was administered retrograde to a septal temperature of  less than 10 degrees Celsius.  Additional cardioplegia was administered at 15-20 minute intervals during the  crossclamp portion of the  procedure.  An aortotomy was made.  The aortic valve was inspected.  The valve was tricuspid, calcific, and stenotic.  There was severe calcification of the leaflets with relatively spared free edges.  The leaflets were excised.  There was severe annular  calcification.  The annulus was debrided, taking care to contain all calcific debris.  The coronary ostia were inspected and were well away from the annulus.  The annulus was copiously irrigated with iced saline.  It measured for a 25 mm  Inspiris Resilia pericardial valve. 2-0 Ethibond horizontal mattress sutures with subannular pledgets were placed circumferentially around the annulus.  A total of 18 sutures were utilized.  The sutures then were passed through the sewing ring of the  valve.  The valve was lowered into place.  It seated nicely.  The sutures were sequentially tied.  The annulus was inspected and probed with a fine-tipped right angle clamp, and there were no gaps noted.  The coronary ostia were inspected and were free  of any impingement and were widely patent.  Rewarming was begun.  The aortotomy was closed in 2 layers.  The first layer was a running 4-0 Prolene horizontal mattress suture.  Initial deairing procedures were performed before tying the suture on this  layer.  The second layer of the closure was a running 4-0 Prolene simple suture.  The patient was placed in Trendelenburg position.  The left ventricular vent was turned off.  The aortic root vent was turned on.  A warm dose of retrograde cardioplegia  was administered.  Additional de-airing maneuvers were performed, and the aortic crossclamp was removed.  The total crossclamp time was 83 minutes.  The patient required a single defibrillation with 10 joules and then was in a bradycardic rhythm  thereafter.  The aortotomy and all cannulation sites were inspected for hemostasis.  The left ventricular vent and retrograde cardioplegia cannulae were removed.  Epicardial pacing wires  were placed on the right ventricle and right atrium.  When the  patient had rewarmed to a core temperature of 37 degrees Celsius, he was weaned from cardiopulmonary bypass on the first attempt without difficulty.  He did not require inotropic support.  He was DDD paced for rate at the time of separation from bypass.   The total bypass time was 113 minutes.  The initial cardiac index was greater than 2 L/min per sq m.  The patient remained hemodynamically stable throughout the post-bypass.  Post-bypass transesophageal echocardiography showed good function of the  prosthetic valve.  There was preserved left ventricular function.  The mean gradient across the valve was 5 mmHg.  A test dose of protamine was administered and was well tolerated.  The atrial and aortic cannulae were removed.  The remainder of the protamine was administered without incident.  The chest was irrigated with warm saline.  Hemostasis was achieved.  The  pericardium was reapproximated with interrupted 3-0 silk sutures.  It came together easily without tension.  Two mediastinal chest tubes were placed.  The sternum was closed with a combination of single and double heavy-gauge stainless steel wires.  The  pectoralis fascia, subcutaneous tissue and skin were closed in standard fashion.  All sponge, needle and instrument counts were correct at the end of the procedure.  The patient was taken from the operating room to the surgical intensive care unit,  intubated  and in good condition.  LN/NUANCE  D:02/16/2018 T:02/16/2018 JOB:[id number]

## 2018-02-16 NOTE — Progress Notes (Signed)
Belongings given to wife.

## 2018-02-16 NOTE — Progress Notes (Signed)
Patient interviewed in the preop area. Patient able to confirm name, DOB, procedure, NKDA, npo status, no metal and no pain.   Leatha Gilding, RN

## 2018-02-16 NOTE — Anesthesia Procedure Notes (Signed)
Central Venous Catheter Insertion Performed by: Roberts Gaudy, MD, anesthesiologist Start/End10/03/2018 7:00 AM, 02/16/2018 7:10 AM Patient location: OR. Preanesthetic checklist: patient identified, IV checked, site marked, risks and benefits discussed, surgical consent, monitors and equipment checked, pre-op evaluation, timeout performed and anesthesia consent Lidocaine 1% used for infiltration and patient sedated Hand hygiene performed  and maximum sterile barriers used  Catheter size: 9 Fr Sheath introducer Procedure performed using ultrasound guided technique. Ultrasound Notes:anatomy identified, needle tip was noted to be adjacent to the nerve/plexus identified, no ultrasound evidence of intravascular and/or intraneural injection and image(s) printed for medical record Attempts: 1 Following insertion, line sutured and dressing applied. Post procedure assessment: blood return through all ports, free fluid flow and no air  Patient tolerated the procedure well with no immediate complications.

## 2018-02-16 NOTE — Anesthesia Procedure Notes (Signed)
Central Venous Catheter Insertion Performed by: Roberts Gaudy, MD, anesthesiologist Start/End10/03/2018 7:00 AM, 02/16/2018 7:10 AM Patient location: Pre-op. Preanesthetic checklist: patient identified, IV checked, site marked, risks and benefits discussed, surgical consent, monitors and equipment checked, pre-op evaluation, timeout performed and anesthesia consent Hand hygiene performed  and maximum sterile barriers used  PA cath was placed.Swan type:thermodilution Procedure performed without using ultrasound guided technique. Attempts: 1 Following insertion, line sutured, dressing applied and Biopatch. Post procedure assessment: blood return through all ports  Patient tolerated the procedure well with no immediate complications.

## 2018-02-16 NOTE — Progress Notes (Signed)
Patient ID: Jon Ferguson, male   DOB: Oct 23, 1940, 77 y.o.   MRN: [id number] EVENING ROUNDS NOTE :     [address]       [place] [address]             [phone]                 Day of Surgery Procedure(s) (LRB): AORTIC VALVE REPLACEMENT (AVR). 24mm EDWARDS LIFESCIENCE INSPIRIS VALVE. (N/A) TRANSESOPHAGEAL ECHOCARDIOGRAM (TEE) (N/A)  Total Length of Stay:  LOS: 5 days  BP (!) 130/91   Pulse 79   Temp 99.3 F (37.4 C)   Resp 16   Ht 5\' 10"  (1.778 m)   Wt 78.9 kg   SpO2 100%   BMI 24.97 kg/m   .Intake/Output      10/11 0701 - 10/12 0700   P.O.    I.V. (mL/kg) 3671.1 (46.5)   Blood 783   IV Piggyback 952.5   Total Intake(mL/kg) 5406.6 (68.5)   Urine (mL/kg/hr) 3385 (3.2)   Chest Tube 144   Total Output 3529   Net +1877.6         . sodium chloride Stopped (02/16/18 1949)  . [START ON 02/17/2018] sodium chloride    . sodium chloride 10 mL/hr at 02/16/18 1306  . cefUROXime (ZINACEF)  IV 200 mL/hr at 02/16/18 2000  . dexmedetomidine (PRECEDEX) IV infusion Stopped (02/16/18 1543)  . famotidine (PEPCID) IV Stopped (02/16/18 1312)  . lactated ringers    . lactated ringers Stopped (02/16/18 1726)  . nitroGLYCERIN Stopped (02/16/18 1246)  . phenylephrine (NEO-SYNEPHRINE) Adult infusion Stopped (02/16/18 1232)  . vancomycin 200 mL/hr at 02/16/18 2000     Lab Results  Component Value Date   WBC 9.2 02/16/2018   HGB 10.2 (L) 02/16/2018   HCT 30.0 (L) 02/16/2018   PLT 126 (L) 02/16/2018   GLUCOSE 129 (H) 02/16/2018   ALT 27 02/16/2018   AST 24 02/16/2018   NA 137 02/16/2018   K 4.4 02/16/2018   CL 105 02/16/2018   CREATININE 0.90 02/16/2018   BUN 21 02/16/2018   CO2 23 02/16/2018   INR 1.48 02/16/2018   HGBA1C 5.0 02/14/2018   stable postop Extubated Neuro intact   Grace Isaac MD  Beeper [phone] Office [phone] 02/16/2018 8:33 PM

## 2018-02-16 NOTE — Procedures (Signed)
Extubation Procedure Note  Patient Details:   Name: Jon Ferguson DOB: 01/11/1941 MRN: [id number]   Airway Documentation:    Vent end date: 02/16/18 Vent end time: 1641   Evaluation  O2 sats: stable throughout Complications: No apparent complications Patient did tolerate procedure well. Bilateral Breath Sounds: Clear, Diminished   Yes  Patient extubated per Cardiac Wean to 2 LPM nasal cannula. NIF was -60 and VC was 1 L. Patient had positive cuff leak, strong productive cough, able to speak name. No stridor noted. BBS clear;diminished. RN at bedside.  [name] M 02/16/2018, 4:42 PM

## 2018-02-16 NOTE — Progress Notes (Signed)
Ref: Patient, No Pcp Per   Subjective:  Sedated and intubated. He underwent AV replacement. VS are stable. Afebrile.  Objective:  Vital Signs in the last 24 hours: Temp:  [95.9 F (35.5 C)-98.6 F (37 C)] 97.5 F (36.4 C) (10/11 1353) Pulse Rate:  [75-85] 75 (10/11 0452) Cardiac Rhythm: A-V Sequential paced (10/11 1215) Resp:  [0-25] 12 (10/11 1353) BP: (98-133)/(75-81) 98/75 (10/11 1300) SpO2:  [97 %-100 %] 100 % (10/11 1315) Arterial Line BP: (113-121)/(67-70) 120/70 (10/11 1315) FiO2 (%):  [50 %] 50 % (10/11 1217) Weight:  [78.9 kg] 78.9 kg (10/11 0408)  Physical Exam: BP Readings from Last 1 Encounters:  02/16/18 98/75     Wt Readings from Last 1 Encounters:  02/16/18 78.9 kg    Weight change: 1.326 kg Body mass index is 24.97 kg/m. HEENT: [place]/AT, Eyes-Blue, Conjunctiva-Pink, Sclera-Non-icteric. ET tube in place Neck: No JVD, No bruit, Trachea midline. Lungs:  Clear, Bilateral. Cardiac:  Regular rhythm, normal S1 and S2, no S3. II/VI systolic murmur. Abdomen:  Soft, non-tender. BS present. Extremities:  No edema present. No cyanosis. No clubbing. CNS: Cranial nerves grossly intact.  Skin: Warm and dry.   Intake/Output from previous day: 10/10 0701 - 10/11 0700 In: 240 [P.O.:240] Out: 325 [Urine:325]    Lab Results: BMET    Component Value Date/Time   NA 138 02/16/2018 1107   NA 135 02/16/2018 1022   NA 139 02/16/2018 0906   K 4.8 02/16/2018 1107   K 5.4 (H) 02/16/2018 1022   K 3.9 02/16/2018 0906   CL 104 02/16/2018 1107   CL 106 02/16/2018 1022   CL 104 02/16/2018 0906   CO2 23 02/16/2018 0423   CO2 26 02/15/2018 0447   CO2 27 02/13/2018 0529   GLUCOSE 99 02/16/2018 1107   GLUCOSE 126 (H) 02/16/2018 1022   GLUCOSE 89 02/16/2018 0906   BUN 24 (H) 02/16/2018 1107   BUN 23 02/16/2018 1022   BUN 21 02/16/2018 0906   CREATININE 1.00 02/16/2018 1107   CREATININE 0.90 02/16/2018 1022   CREATININE 0.80 02/16/2018 0906   CALCIUM 8.3 (L) 02/16/2018  0423   CALCIUM 8.9 02/15/2018 0447   CALCIUM 9.5 02/13/2018 0529   GFRNONAA 53 (L) 02/16/2018 0423   GFRNONAA 53 (L) 02/15/2018 0447   GFRNONAA 48 (L) 02/13/2018 0529   GFRAA >60 02/16/2018 0423   GFRAA >60 02/15/2018 0447   GFRAA 56 (L) 02/13/2018 0529   CBC    Component Value Date/Time   WBC 9.8 02/16/2018 1221   RBC 3.32 (L) 02/16/2018 1221   HGB 10.3 (L) 02/16/2018 1221   HCT 32.7 (L) 02/16/2018 1221   PLT 107 (L) 02/16/2018 1221   MCV 98.5 02/16/2018 1221   MCH 31.0 02/16/2018 1221   MCHC 31.5 02/16/2018 1221   RDW 13.1 02/16/2018 1221   LYMPHSABS 1.6 02/16/2018 0423   MONOABS 0.7 02/16/2018 0423   EOSABS 0.3 02/16/2018 0423   BASOSABS 0.1 02/16/2018 0423   HEPATIC Function Panel Recent Labs    02/16/18 0423  PROT 5.7*   HEMOGLOBIN A1C No components found for: HGA1C,  MPG CARDIAC ENZYMES No results found for: CKTOTAL, CKMB, CKMBINDEX, TROPONINI BNP No results for input(s): PROBNP in the last 8760 hours. TSH No results for input(s): TSH in the last 8760 hours. CHOLESTEROL No results for input(s): CHOL in the last 8760 hours.  Scheduled Meds: . [START ON 02/17/2018] acetaminophen  1,000 mg Oral Q6H   Or  . [START ON 02/17/2018] acetaminophen (  TYLENOL) oral liquid 160 mg/5 mL  1,000 mg Per Tube Q6H  . acetaminophen (TYLENOL) oral liquid 160 mg/5 mL  650 mg Per Tube Once   Or  . acetaminophen  650 mg Rectal Once  . [START ON 02/17/2018] aspirin EC  325 mg Oral Daily   Or  . [START ON 02/17/2018] aspirin  324 mg Per Tube Daily  . [START ON 02/17/2018] bisacodyl  10 mg Oral Daily   Or  . [START ON 02/17/2018] bisacodyl  10 mg Rectal Daily  . [START ON 02/17/2018] docusate sodium  200 mg Oral Daily  . insulin aspart  0-24 Units Subcutaneous Q4H  . metoprolol tartrate  12.5 mg Oral BID   Or  . metoprolol tartrate  12.5 mg Per Tube BID  . [START ON 02/18/2018] pantoprazole  40 mg Oral Daily  . [START ON 02/17/2018] sodium chloride flush  3 mL Intravenous  Q12H   Continuous Infusions: . sodium chloride Stopped (02/16/18 1241)  . [START ON 02/17/2018] sodium chloride    . sodium chloride 10 mL/hr at 02/16/18 1306  . albumin human    . cefUROXime (ZINACEF)  IV    . dexmedetomidine (PRECEDEX) IV infusion 0.5 mcg/kg/hr (02/16/18 1300)  . famotidine (PEPCID) IV 100 mL/hr at 02/16/18 1300  . lactated ringers    . lactated ringers    . lactated ringers 20 mL/hr at 02/16/18 1300  . magnesium sulfate 4 g (02/16/18 1307)  . nitroGLYCERIN Stopped (02/16/18 1246)  . phenylephrine (NEO-SYNEPHRINE) Adult infusion Stopped (02/16/18 1232)  . potassium chloride    . vancomycin     PRN Meds:.sodium chloride, albumin human, lactated ringers, metoprolol tartrate, midazolam, morphine injection, morphine injection, ondansetron (ZOFRAN) IV, oxyCODONE, [START ON 02/17/2018] sodium chloride flush, traMADol  Assessment/Plan: Syncope S/P AV replacement CAD HTN CKD, II  Follow with surgery.   LOS: 5 days    Dixie Dials  MD  02/16/2018, 2:04 PM

## 2018-02-16 NOTE — Transfer of Care (Signed)
Immediate Anesthesia Transfer of Care Note  Patient: Jon Ferguson  Procedure(s) Performed: AORTIC VALVE REPLACEMENT (AVR). 75mm EDWARDS LIFESCIENCE INSPIRIS VALVE. (N/A Chest) TRANSESOPHAGEAL ECHOCARDIOGRAM (TEE) (N/A )  Patient Location: SICU  Anesthesia Type:General  Level of Consciousness: sedated and Patient remains intubated per anesthesia plan  Airway & Oxygen Therapy: Patient remains intubated per anesthesia plan and Patient placed on Ventilator (see vital sign flow sheet for setting)  Post-op Assessment: Report given to RN and Post -op Vital signs reviewed and stable  Post vital signs: Reviewed and stable  Last Vitals:  Vitals Value Taken Time  BP 93/66 02/16/2018 12:13 PM  Temp    Pulse    Resp 16 02/16/2018 12:16 PM  SpO2 100 % 02/16/2018 12:16 PM  Vitals shown include unvalidated device data.  Last Pain:  Vitals:   02/16/18 0411  TempSrc: Oral  PainSc: 0-No pain         Complications: No apparent anesthesia complications

## 2018-02-16 NOTE — Interval H&P Note (Signed)
History and Physical Interval Note:  02/16/2018 7:23 AM  Jon Ferguson  has presented today for surgery, with the diagnosis of SEVERE AS  The various methods of treatment have been discussed with the patient and family. After consideration of risks, benefits and other options for treatment, the patient has consented to  Procedure(s): AORTIC VALVE REPLACEMENT (AVR) (N/A) TRANSESOPHAGEAL ECHOCARDIOGRAM (TEE) (N/A) as a surgical intervention .  The patient's history has been reviewed, patient examined, no change in status, stable for surgery.  I have reviewed the patient's chart and labs.  Questions were answered to the patient's satisfaction.     [name]

## 2018-02-16 NOTE — Progress Notes (Signed)
  Echocardiogram Echocardiogram Transesophageal has been performed.  Jon Ferguson 02/16/2018, 9:19 AM

## 2018-02-16 NOTE — Anesthesia Procedure Notes (Addendum)
Arterial Line Insertion Start/End10/03/2018 7:00 AM, 02/16/2018 7:05 AM Performed by: Verdie Drown, CRNA, CRNA  Patient location: Pre-op. Preanesthetic checklist: patient identified, IV checked, site marked, risks and benefits discussed, surgical consent, monitors and equipment checked, pre-op evaluation, timeout performed and anesthesia consent Lidocaine 1% used for infiltration Left, radial was placed Catheter size: 20 G Hand hygiene performed  and maximum sterile barriers used   Attempts: 1 Procedure performed without using ultrasound guided technique. Following insertion, Biopatch and dressing applied. Patient tolerated the procedure well with no immediate complications.

## 2018-02-16 NOTE — Anesthesia Preprocedure Evaluation (Signed)
Anesthesia Evaluation  Patient identified by MRN, date of birth, ID band Patient awake    Reviewed: Allergy & Precautions, NPO status , Patient's Chart, lab work & pertinent test results  Airway Mallampati: II  TM Distance: >3 FB Neck ROM: Full    Dental  (+) Edentulous Upper, Edentulous Lower   Pulmonary    breath sounds clear to auscultation- rhonchi       Cardiovascular hypertension,  Rhythm:Regular Rate:Normal + Systolic murmurs    Neuro/Psych    GI/Hepatic   Endo/Other    Renal/GU      Musculoskeletal   Abdominal   Peds  Hematology   Anesthesia Other Findings   Reproductive/Obstetrics                             Anesthesia Physical Anesthesia Plan  ASA: III  Anesthesia Plan: General   Post-op Pain Management:    Induction: Intravenous  PONV Risk Score and Plan: Ondansetron and Dexamethasone  Airway Management Planned: Oral ETT  Additional Equipment: Arterial line, PA Cath, 3D TEE and Ultrasound Guidance Line Placement  Intra-op Plan:   Post-operative Plan: Post-operative intubation/ventilation  Informed Consent: I have reviewed the patients History and Physical, chart, labs and discussed the procedure including the risks, benefits and alternatives for the proposed anesthesia with the patient or authorized representative who has indicated his/her understanding and acceptance.     Plan Discussed with: CRNA and Anesthesiologist  Anesthesia Plan Comments:         Anesthesia Quick Evaluation

## 2018-02-17 ENCOUNTER — Inpatient Hospital Stay (HOSPITAL_COMMUNITY): Payer: Medicare Other

## 2018-02-17 LAB — CBC
HCT: 36.8 % — ABNORMAL LOW (ref 39.0–52.0)
HCT: 36.4 % — ABNORMAL LOW (ref 39.0–52.0)
Hemoglobin: 11.8 g/dL — ABNORMAL LOW (ref 13.0–17.0)
Hemoglobin: 11.6 g/dL — ABNORMAL LOW (ref 13.0–17.0)
MCH: 31.2 pg (ref 26.0–34.0)
MCH: 31.2 pg (ref 26.0–34.0)
MCHC: 32.1 g/dL (ref 30.0–36.0)
MCHC: 31.9 g/dL (ref 30.0–36.0)
MCV: 97.4 fL (ref 80.0–100.0)
MCV: 97.8 fL (ref 80.0–100.0)
NRBC: 0 % (ref 0.0–0.2)
PLATELETS: 162 10*3/uL (ref 150–400)
PLATELETS: 184 10*3/uL (ref 150–400)
RBC: 3.78 MIL/uL — ABNORMAL LOW (ref 4.22–5.81)
RBC: 3.72 MIL/uL — AB (ref 4.22–5.81)
RDW: 13.2 % (ref 11.5–15.5)
RDW: 13.2 % (ref 11.5–15.5)
WBC: 13.2 10*3/uL — AB (ref 4.0–10.5)
WBC: 13.7 10*3/uL — AB (ref 4.0–10.5)
nRBC: 0 % (ref 0.0–0.2)

## 2018-02-17 LAB — POCT I-STAT, CHEM 8
BUN: 28 mg/dL — ABNORMAL HIGH (ref 8–23)
CREATININE: 1.2 mg/dL (ref 0.61–1.24)
Calcium, Ion: 1.22 mmol/L (ref 1.15–1.40)
Chloride: 102 mmol/L (ref 98–111)
GLUCOSE: 121 mg/dL — AB (ref 70–99)
HEMATOCRIT: 34 % — AB (ref 39.0–52.0)
HEMOGLOBIN: 11.6 g/dL — AB (ref 13.0–17.0)
Potassium: 4.5 mmol/L (ref 3.5–5.1)
Sodium: 139 mmol/L (ref 135–145)
TCO2: 24 mmol/L (ref 22–32)

## 2018-02-17 LAB — GLUCOSE, CAPILLARY
GLUCOSE-CAPILLARY: 134 mg/dL — AB (ref 70–99)
Glucose-Capillary: 139 mg/dL — ABNORMAL HIGH (ref 70–99)
Glucose-Capillary: 114 mg/dL — ABNORMAL HIGH (ref 70–99)
Glucose-Capillary: 134 mg/dL — ABNORMAL HIGH (ref 70–99)

## 2018-02-17 LAB — BASIC METABOLIC PANEL
Anion gap: 8 (ref 5–15)
BUN: 20 mg/dL (ref 8–23)
CALCIUM: 8.3 mg/dL — AB (ref 8.9–10.3)
CO2: 22 mmol/L (ref 22–32)
CREATININE: 1.14 mg/dL (ref 0.61–1.24)
Chloride: 108 mmol/L (ref 98–111)
Glucose, Bld: 141 mg/dL — ABNORMAL HIGH (ref 70–99)
Potassium: 4.5 mmol/L (ref 3.5–5.1)
SODIUM: 138 mmol/L (ref 135–145)

## 2018-02-17 LAB — MAGNESIUM
MAGNESIUM: 2.7 mg/dL — AB (ref 1.7–2.4)
MAGNESIUM: 2.8 mg/dL — AB (ref 1.7–2.4)

## 2018-02-17 LAB — CREATININE, SERUM
CREATININE: 1.22 mg/dL (ref 0.61–1.24)
GFR, EST NON AFRICAN AMERICAN: 56 mL/min — AB (ref >60)

## 2018-02-17 MED ORDER — ENOXAPARIN SODIUM 30 MG/0.3ML ~~LOC~~ SOLN
30.0000 mg | Freq: Every day | SUBCUTANEOUS | Status: DC
  Administered 2018-02-17 – 2018-02-18 (×2): 30 mg via SUBCUTANEOUS
  Filled 2018-02-17 (×2): qty 0.3

## 2018-02-17 MED ORDER — LISINOPRIL 5 MG PO TABS
5.0000 mg | ORAL_TABLET | Freq: Every day | ORAL | Status: DC
  Administered 2018-02-17 – 2018-02-22 (×6): 5 mg via ORAL
  Filled 2018-02-17 (×6): qty 1

## 2018-02-17 MED ORDER — INSULIN ASPART 100 UNIT/ML ~~LOC~~ SOLN: 0.0000 [IU] | SUBCUTANEOUS | Status: DC

## 2018-02-17 MED ORDER — METOPROLOL TARTRATE 25 MG PO TABS
25.0000 mg | ORAL_TABLET | Freq: Two times a day (BID) | ORAL | Status: DC
  Administered 2018-02-17 – 2018-02-22 (×11): 25 mg via ORAL
  Filled 2018-02-17 (×11): qty 1

## 2018-02-17 MED ORDER — METOPROLOL TARTRATE 12.5 MG HALF TABLET: 12.5000 mg | ORAL_TABLET | Freq: Two times a day (BID) | ORAL | Status: DC

## 2018-02-17 MED ORDER — METOPROLOL TARTRATE 25 MG/10 ML ORAL SUSPENSION: 25.0000 mg | Freq: Two times a day (BID) | ORAL | Status: DC

## 2018-02-17 MED ORDER — INSULIN ASPART 100 UNIT/ML ~~LOC~~ SOLN
0.0000 [IU] | Freq: Three times a day (TID) | SUBCUTANEOUS | Status: DC
  Administered 2018-02-17 – 2018-02-18 (×2): 2 [IU] via SUBCUTANEOUS

## 2018-02-17 NOTE — Plan of Care (Signed)
  Problem: Education: Goal: Ability to demonstrate management of disease process will improve Outcome: Progressing Goal: Ability to verbalize understanding of medication therapies will improve Outcome: Progressing Goal: Individualized Educational Video(s) Outcome: Progressing   Problem: Activity: Goal: Capacity to carry out activities will improve Outcome: Progressing   Problem: Cardiac: Goal: Ability to achieve and maintain adequate cardiopulmonary perfusion will improve Outcome: Progressing   Problem: Education: Goal: Knowledge of General Education information will improve Description Including pain rating scale, medication(s)/side effects and non-pharmacologic comfort measures Outcome: Progressing   Problem: Health Behavior/Discharge Planning: Goal: Ability to manage health-related needs will improve Outcome: Progressing   Problem: Clinical Measurements: Goal: Ability to maintain clinical measurements within normal limits will improve Outcome: Progressing Goal: Will remain free from infection Outcome: Progressing Goal: Diagnostic test results will improve Outcome: Progressing Goal: Respiratory complications will improve Outcome: Progressing Goal: Cardiovascular complication will be avoided Outcome: Progressing   Problem: Activity: Goal: Risk for activity intolerance will decrease Outcome: Progressing   Problem: Nutrition: Goal: Adequate nutrition will be maintained Outcome: Progressing   Problem: Coping: Goal: Level of anxiety will decrease Outcome: Progressing   Problem: Pain Managment: Goal: General experience of comfort will improve Outcome: Progressing

## 2018-02-17 NOTE — Progress Notes (Signed)
Subjective:  Doing well denies any anginal chest pain or shortness of breath. Patient off pressors.  Objective:  Vital Signs in the last 24 hours: Temp:  [95.9 F (35.5 C)-99.9 F (37.7 C)] 99 F (37.2 C) (10/12 0747) Pulse Rate:  [79-83] 79 (10/11 1642) Resp:  [0-22] 15 (10/12 0747) BP: (98-164)/(67-109) 149/105 (10/12 0747) SpO2:  [98 %-100 %] 98 % (10/12 0452) Arterial Line BP: (90-180)/(52-144) 180/89 (10/12 0747) FiO2 (%):  [40 %-50 %] 40 % (10/11 1600) Weight:  [82.8 kg] 82.8 kg (10/12 0600)  Intake/Output from previous day: 10/11 0701 - 10/12 0700 In: 5874.1 [I.V.:3844.2; Blood:783; IV Piggyback:1246.9] Out: 2706 [Urine:3935; Chest Tube:288] Intake/Output from this shift: No intake/output data recorded.  Physical Exam: Neck: no adenopathy, no carotid bruit, no JVD and supple, symmetrical, trachea midline Lungs: decreased breath sound at bases Heart: regular rate and rhythm, S1, S2 normal and systolic murmur noted no pericardial rub Abdomen: soft, non-tender; bowel sounds normal; no masses,  no organomegaly Extremities: extremities normal, atraumatic, no cyanosis or edema  Lab Results: Recent Labs    02/16/18 1739 02/16/18 1753 02/17/18 0420  WBC 9.2  --  13.2*  HGB 10.7* 10.2* 11.8*  PLT 126*  --  162   Recent Labs    02/16/18 0423  02/16/18 1753 02/17/18 0420  NA 138   < > 137 138  K 3.7   < > 4.4 4.5  CL 108   < > 105 108  CO2 23  --   --  22  GLUCOSE 101*   < > 129* 141*  BUN 26*   < > 21 20  CREATININE 1.28*   < > 0.90 1.14   < > = values in this interval not displayed.   No results for input(s): TROPONINI in the last 72 hours.  Invalid input(s): CK, MB Hepatic Function Panel Recent Labs    02/16/18 0423  PROT 5.7*  ALBUMIN 3.0*  AST 24  ALT 27  ALKPHOS 32*  BILITOT 0.5   No results for input(s): CHOL in the last 72 hours. No results for input(s): PROTIME in the last 72 hours.  Imaging: Imaging results have been reviewed and Dg Chest  Port 1 View  Result Date: 02/16/2018 CLINICAL DATA:  S/p AVR ETT present EXAM: PORTABLE CHEST 1 VIEW COMPARISON:  02/14/2018 FINDINGS: Endotracheal tube is in place, tip approximately 13 millimeters above the carina. A nasogastric tube is in place, tip beyond the gastroesophageal junction off the image. A RIGHT IJ Swan-Ganz catheter tip overlies the level of the RIGHT main pulmonary artery. Mediastinal drains are present. There is perihilar atelectasis bilaterally. No pulmonary edema. LEFT mid chest is partially obscured by overlying support apparatus. Status post median sternotomy and aortic valve replacement. IMPRESSION: Postoperative appearance of the chest. Perihilar atelectasis.  No pneumothorax. Electronically Signed   By: Nolon Nations M.D.   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  LOS: 6 days    Charolette Forward 02/17/2018, 7:57 AM

## 2018-02-17 NOTE — Progress Notes (Addendum)
Patient ID: Jon Ferguson, male   DOB: 01/15/1941, 77 y.o.   MRN: [id number] TCTS DAILY ICU PROGRESS NOTE                   [address]            [place] [address]          [phone]   1 Day Post-Op Procedure(s) (LRB): AORTIC VALVE REPLACEMENT (AVR). 64mm EDWARDS LIFESCIENCE INSPIRIS VALVE. (N/A) TRANSESOPHAGEAL ECHOCARDIOGRAM (TEE) (N/A)  Total Length of Stay:  LOS: 6 days   Subjective: Awake and alert, neuro intact  Objective: Vital signs in last 24 hours: Temp:  [95.9 F (35.5 C)-99.9 F (37.7 C)] 99 F (37.2 C) (10/12 0747) Pulse Rate:  [79-83] 79 (10/11 1642) Cardiac Rhythm: Heart block;Normal sinus rhythm (10/12 0400) Resp:  [0-22] 15 (10/12 0747) BP: (98-164)/(67-109) 149/105 (10/12 0747) SpO2:  [98 %-100 %] 98 % (10/12 0452) Arterial Line BP: (90-180)/(52-144) 180/89 (10/12 0747) FiO2 (%):  [40 %-50 %] 40 % (10/11 1600) Weight:  [82.8 kg] 82.8 kg (10/12 0600)  Filed Weights   02/15/18 0535 02/16/18 0408 02/17/18 0600  Weight: 77.6 kg 78.9 kg 82.8 kg    Weight change: 3.874 kg   Hemodynamic parameters for last 24 hours: PAP: (20-36)/(9-16) 36/14 CO:  [3.7 L/min-5.7 L/min] 4.9 L/min CI:  [1.9 L/min/m2-2.9 L/min/m2] 2.5 L/min/m2  Intake/Output from previous day: 10/11 0701 - 10/12 0700 In: 5874.1 [I.V.:3844.2; Blood:783; IV Piggyback:1246.9] Out: 3151 [Urine:3935; Chest Tube:288]  Intake/Output this shift: No intake/output data recorded.  Current Meds: Scheduled Meds: . acetaminophen  1,000 mg Oral Q6H   Or  . acetaminophen (TYLENOL) oral liquid 160 mg/5 mL  1,000 mg Per Tube Q6H  . aspirin EC  325 mg Oral Daily   Or  . aspirin  324 mg Per Tube Daily  . bisacodyl  10 mg Oral Daily   Or  . bisacodyl  10 mg Rectal Daily  . docusate sodium  200 mg Oral Daily  . insulin aspart  0-24 Units Subcutaneous Q4H  . metoprolol tartrate  25 mg Per Tube BID   Or  . metoprolol tartrate  12.5 mg Oral BID  . [START ON 02/18/2018] pantoprazole   40 mg Oral Daily  . sodium chloride flush  3 mL Intravenous Q12H   Continuous Infusions: . sodium chloride 20 mL/hr at 02/17/18 0600  . sodium chloride    . sodium chloride 10 mL/hr at 02/17/18 0040  . cefUROXime (ZINACEF)  IV 1.5 g (02/17/18 0756)  . dexmedetomidine (PRECEDEX) IV infusion Stopped (02/16/18 1543)  . lactated ringers    . lactated ringers Stopped (02/16/18 1726)  . nitroGLYCERIN Stopped (02/16/18 1246)  . phenylephrine (NEO-SYNEPHRINE) Adult infusion Stopped (02/16/18 1232)   PRN Meds:.sodium chloride, metoprolol tartrate, midazolam, morphine injection, ondansetron (ZOFRAN) IV, oxyCODONE, sodium chloride flush, traMADol  General appearance: alert, cooperative and appears stated age Neurologic: intact Heart: regular rate and rhythm, S1, S2 normal, no murmur, click, rub or gallop Lungs: diminished breath sounds bibasilar Abdomen: soft, non-tender; bowel sounds normal; no masses,  no organomegaly Extremities: extremities normal, atraumatic, no cyanosis or edema and Homans sign is negative, no sign of DVT Wound: sternum stable   Lab Results: CBC: Recent Labs    02/16/18 1739 02/16/18 1753 02/17/18 0420  WBC 9.2  --  13.2*  HGB 10.7* 10.2* 11.8*  HCT 33.4* 30.0* 36.8*  PLT 126*  --  162   BMET:  Recent Labs  02/16/18 0423  02/16/18 1753 02/17/18 0420  NA 138   < > 137 138  K 3.7   < > 4.4 4.5  CL 108   < > 105 108  CO2 23  --   --  22  GLUCOSE 101*   < > 129* 141*  BUN 26*   < > 21 20  CREATININE 1.28*   < > 0.90 1.14  CALCIUM 8.3*  --   --  8.3*   < > = values in this interval not displayed.    CMET: Lab Results  Component Value Date   WBC 13.2 (H) 02/17/2018   HGB 11.8 (L) 02/17/2018   HCT 36.8 (L) 02/17/2018   PLT 162 02/17/2018   GLUCOSE 141 (H) 02/17/2018   ALT 27 02/16/2018   AST 24 02/16/2018   NA 138 02/17/2018   K 4.5 02/17/2018   CL 108 02/17/2018   CREATININE 1.14 02/17/2018   BUN 20 02/17/2018   CO2 22 02/17/2018   INR 1.48  02/16/2018   HGBA1C 5.0 02/14/2018      PT/INR:  Recent Labs    02/16/18 1221  LABPROT 17.8*  INR 1.48   Radiology: Dg Chest Port 1 View  Result Date: 02/17/2018 CLINICAL DATA:  Status post aortic valve repair EXAM: PORTABLE CHEST 1 VIEW COMPARISON:  02/16/2018 FINDINGS: Cardiac shadow remains enlarged. Postsurgical changes are again seen. Nasogastric catheter and endotracheal tube have been removed in the interval. Swan-Ganz catheter and mediastinal drains are again seen and stable. Lucency is noted at the right lung base. Possibility of underlying free air could not be totally excluded. Positional abdominal films are recommended for further evaluation IMPRESSION: Tubes and lines as described above. Lucency in the right lung base which may represent some free intraperitoneal air. Positional abdominal films may be helpful for further evaluation. These results will be called to the ordering clinician or representative by the Radiologist Assistant, and communication documented in the PACS or zVision Dashboard. Electronically Signed   By: Inez Catalina M.D.   On: 02/17/2018 08:49   Dg Chest Port 1 View  Result Date: 02/16/2018 CLINICAL DATA:  S/p AVR ETT present EXAM: PORTABLE CHEST 1 VIEW COMPARISON:  02/14/2018 FINDINGS: Endotracheal tube is in place, tip approximately 13 millimeters above the carina. A nasogastric tube is in place, tip beyond the gastroesophageal junction off the image. A RIGHT IJ Swan-Ganz catheter tip overlies the level of the RIGHT main pulmonary artery. Mediastinal drains are present. There is perihilar atelectasis bilaterally. No pulmonary edema. LEFT mid chest is partially obscured by overlying support apparatus. Status post median sternotomy and aortic valve replacement. IMPRESSION: Postoperative appearance of the chest. Perihilar atelectasis.  No pneumothorax. Electronically Signed   By: Nolon Nations M.D.   On: 02/16/2018 12:59   Chronic Kidney Disease   Stage I      GFR >90  Stage II    GFR 60-89  Stage IIIA GFR 45-59  Stage IIIB GFR 30-44  Stage IV   GFR 15-29  Stage V    GFR  <15  Lab Results  Component Value Date   CREATININE 1.14 02/17/2018   Estimated Creatinine Clearance: 57.8 mL/min (by C-G formula based on SCr of 1.14 mg/dL).   Assessment/Plan: S/P Procedure(s) (LRB): AORTIC VALVE REPLACEMENT (AVR). 14mm EDWARDS LIFESCIENCE INSPIRIS VALVE. (N/A) TRANSESOPHAGEAL ECHOCARDIOGRAM (TEE) (N/A) Mobilize Diuresis See progression orders bp elevated, increase bp meds ,  Expected Acute  Blood - loss Anemia- continue to monitor  Renal function  stable   Grace Isaac 02/17/2018 9:09 AM

## 2018-02-17 NOTE — Progress Notes (Signed)
Patient ID: Jon Ferguson, male   DOB: [birth year], 77 y.o.   MRN: [id number] EVENING ROUNDS NOTE :     [address]       [place] [address]             [phone]                 1 Day Post-Op Procedure(s) (LRB): AORTIC VALVE REPLACEMENT (AVR). 52mm EDWARDS LIFESCIENCE INSPIRIS VALVE. (N/A) TRANSESOPHAGEAL ECHOCARDIOGRAM (TEE) (N/A)  Total Length of Stay:  LOS: 6 days  BP (!) 143/71   Pulse 79   Temp 98.5 F (36.9 C) (Oral)   Resp 19   Ht 5\' 10"  (1.778 m)   Wt 82.8 kg   SpO2 94%   BMI 26.19 kg/m   .Intake/Output      10/12 0701 - 10/13 0700   P.O. 300   I.V. (mL/kg) 79.6 (1)   Blood    IV Piggyback 100.1   Total Intake(mL/kg) 479.7 (5.8)   Urine (mL/kg/hr) 385 (0.4)   Chest Tube 20   Total Output 405   Net +74.7         . sodium chloride Stopped (02/17/18 1029)  . sodium chloride    . sodium chloride 10 mL/hr at 02/17/18 0040  . cefUROXime (ZINACEF)  IV Stopped (02/17/18 4132)  . dexmedetomidine (PRECEDEX) IV infusion Stopped (02/16/18 1543)  . lactated ringers    . lactated ringers Stopped (02/16/18 1726)  . nitroGLYCERIN Stopped (02/16/18 1246)  . phenylephrine (NEO-SYNEPHRINE) Adult infusion Stopped (02/16/18 1232)     Lab Results  Component Value Date   WBC 13.7 (H) 02/17/2018   HGB 11.6 (L) 02/17/2018   HCT 36.4 (L) 02/17/2018   PLT 184 02/17/2018   GLUCOSE 121 (H) 02/17/2018   ALT 27 02/16/2018   AST 24 02/16/2018   NA 139 02/17/2018   K 4.5 02/17/2018   CL 102 02/17/2018   CREATININE 1.22 02/17/2018   BUN 28 (H) 02/17/2018   CO2 22 02/17/2018   INR 1.48 02/16/2018   HGBA1C 5.0 02/14/2018   bp still elevated with increased beta blocker Will add ace    Grace Isaac MD  Beeper [phone] Office [phone] 02/17/2018 7:13 PM

## 2018-02-18 ENCOUNTER — Inpatient Hospital Stay (HOSPITAL_COMMUNITY): Payer: Medicare Other

## 2018-02-18 LAB — BASIC METABOLIC PANEL WITH GFR
Anion gap: 8 (ref 5–15)
BUN: 26 mg/dL — ABNORMAL HIGH (ref 8–23)
CO2: 24 mmol/L (ref 22–32)
Calcium: 8.6 mg/dL — ABNORMAL LOW (ref 8.9–10.3)
Chloride: 104 mmol/L (ref 98–111)
Creatinine, Ser: 1.21 mg/dL (ref 0.61–1.24)
GFR calc Af Amer: >60 mL/min
GFR calc non Af Amer: 57 mL/min — ABNORMAL LOW
Glucose, Bld: 132 mg/dL — ABNORMAL HIGH (ref 70–99)
Potassium: 4.5 mmol/L (ref 3.5–5.1)
Sodium: 136 mmol/L (ref 135–145)

## 2018-02-18 LAB — GLUCOSE, CAPILLARY
GLUCOSE-CAPILLARY: 121 mg/dL — AB (ref 70–99)
GLUCOSE-CAPILLARY: 110 mg/dL — AB (ref 70–99)
Glucose-Capillary: 109 mg/dL — ABNORMAL HIGH (ref 70–99)

## 2018-02-18 LAB — CBC
HCT: 35 % — ABNORMAL LOW (ref 39.0–52.0)
Hemoglobin: 11 g/dL — ABNORMAL LOW (ref 13.0–17.0)
MCH: 31 pg (ref 26.0–34.0)
MCHC: 31.4 g/dL (ref 30.0–36.0)
MCV: 98.6 fL (ref 80.0–100.0)
Platelets: 181 K/uL (ref 150–400)
RBC: 3.55 MIL/uL — ABNORMAL LOW (ref 4.22–5.81)
RDW: 13.3 % (ref 11.5–15.5)
WBC: 13.1 K/uL — ABNORMAL HIGH (ref 4.0–10.5)
nRBC: 0 % (ref 0.0–0.2)

## 2018-02-18 MED ORDER — BISACODYL 10 MG RE SUPP: 10.0000 mg | Freq: Every day | RECTAL | Status: DC | PRN

## 2018-02-18 MED ORDER — MOVING RIGHT ALONG BOOK
Freq: Once | Status: AC
  Administered 2018-02-18: 10:00:00
  Filled 2018-02-18: qty 1

## 2018-02-18 MED ORDER — ACETAMINOPHEN 325 MG PO TABS: 650.0000 mg | ORAL_TABLET | Freq: Four times a day (QID) | ORAL | Status: DC | PRN

## 2018-02-18 MED ORDER — SODIUM CHLORIDE 0.9 % IV SOLN: 250.0000 mL | INTRAVENOUS | Status: DC | PRN

## 2018-02-18 MED ORDER — ASPIRIN EC 325 MG PO TBEC
325.0000 mg | DELAYED_RELEASE_TABLET | Freq: Every day | ORAL | Status: DC
  Administered 2018-02-19 – 2018-02-22 (×4): 325 mg via ORAL
  Filled 2018-02-18 (×4): qty 1

## 2018-02-18 MED ORDER — ALUM & MAG HYDROXIDE-SIMETH 200-200-20 MG/5ML PO SUSP: 15.0000 mL | ORAL | Status: DC | PRN

## 2018-02-18 MED ORDER — BISACODYL 5 MG PO TBEC: 10.0000 mg | DELAYED_RELEASE_TABLET | Freq: Every day | ORAL | Status: DC | PRN

## 2018-02-18 MED ORDER — SODIUM CHLORIDE 0.9% FLUSH
3.0000 mL | Freq: Two times a day (BID) | INTRAVENOUS | Status: DC
  Administered 2018-02-18 – 2018-02-22 (×8): 3 mL via INTRAVENOUS

## 2018-02-18 MED ORDER — GUAIFENESIN ER 600 MG PO TB12
600.0000 mg | ORAL_TABLET | Freq: Two times a day (BID) | ORAL | Status: DC | PRN
  Administered 2018-02-20: 600 mg via ORAL
  Filled 2018-02-18: qty 1

## 2018-02-18 MED ORDER — ONDANSETRON HCL 4 MG PO TABS: 4.0000 mg | ORAL_TABLET | Freq: Four times a day (QID) | ORAL | Status: DC | PRN

## 2018-02-18 MED ORDER — ONDANSETRON HCL 4 MG/2ML IJ SOLN: 4.0000 mg | Freq: Four times a day (QID) | INTRAMUSCULAR | Status: DC | PRN

## 2018-02-18 MED ORDER — SODIUM CHLORIDE 0.9% FLUSH: 3.0000 mL | INTRAVENOUS | Status: DC | PRN

## 2018-02-18 MED ORDER — TRAMADOL HCL 50 MG PO TABS: 50.0000 mg | ORAL_TABLET | ORAL | Status: DC | PRN

## 2018-02-18 MED ORDER — INSULIN ASPART 100 UNIT/ML ~~LOC~~ SOLN
0.0000 [IU] | Freq: Three times a day (TID) | SUBCUTANEOUS | Status: DC
  Administered 2018-02-18: 2 [IU] via SUBCUTANEOUS

## 2018-02-18 MED ORDER — OXYCODONE HCL 5 MG PO TABS: 5.0000 mg | ORAL_TABLET | ORAL | Status: DC | PRN

## 2018-02-18 MED ORDER — DOCUSATE SODIUM 100 MG PO CAPS
200.0000 mg | ORAL_CAPSULE | Freq: Every day | ORAL | Status: DC
  Administered 2018-02-19 – 2018-02-21 (×3): 200 mg via ORAL
  Filled 2018-02-18 (×5): qty 2

## 2018-02-18 NOTE — Addendum Note (Signed)
Addendum  created 02/18/18 2820 by Roberts Gaudy, MD   Sign clinical note

## 2018-02-18 NOTE — Progress Notes (Signed)
Anesthesiology Follow-up:  77 year old man with severe AS and single vessel CAD, 2 days S/P AVR and CABG X 1  Awake and alert, neuro intact, sitting in chair taking PO well.  VS: T- 37 BP- 129/86 HR- 81(SR) RR- 20 O2 sat 95% on RA  K-4.5 Na-136 BUN/Cr.- 26/1.21 glucose- 97 H/H- 11.0/35 Platelets- 181,000  Extubated 4 1/2 hours post-op.  Stable post-op course, BP elevated initially post-op, now improved, no apparent complications  Roberts Gaudy

## 2018-02-18 NOTE — Progress Notes (Signed)
Subjective:  Denies any chest pain no shortness of breath patient up in chair. Blood pressure better controlled. Remains in sinus rhythm. No arrhythmias on the monitor  Objective:  Vital Signs in the last 24 hours: Temp:  [98 F (36.7 C)-98.6 F (37 C)] 98 F (36.7 C) (10/13 0800) Pulse Rate:  [70-95] 70 (10/13 0400) Resp:  [12-26] 16 (10/13 1100) BP: (108-176)/(71-114) 108/82 (10/13 1100) SpO2:  [91 %-98 %] 96 % (10/13 1100) Weight:  [82.1 kg] 82.1 kg (10/13 0500)  Intake/Output from previous day: 10/12 0701 - 10/13 0700 In: 987.5 [P.O.:660; I.V.:127.5; IV Piggyback:200] Out: 845 [Urine:825; Chest Tube:20] Intake/Output from this shift: Total I/O In: 340 [P.O.:240; IV Piggyback:100] Out: -   Physical Exam: Neck: no adenopathy, no carotid bruit, no JVD and supple, symmetrical, trachea midline Lungs: decreased breath sound at bases Heart: regular rate and rhythm, S1, S2 normal and soft systolic murmur noted no pericardial rub Abdomen: soft, non-tender; bowel sounds normal; no masses,  no organomegaly Extremities: extremities normal, atraumatic, no cyanosis or edema  Lab Results: Recent Labs    02/17/18 1647 02/18/18 0337  WBC 13.7* 13.1*  HGB 11.6* 11.0*  PLT 184 181   Recent Labs    02/17/18 0420 02/17/18 1639 02/17/18 1647 02/18/18 0337  NA 138 139  --  136  K 4.5 4.5  --  4.5  CL 108 102  --  104  CO2 22  --   --  24  GLUCOSE 141* 121*  --  132*  BUN 20 28*  --  26*  CREATININE 1.14 1.20 1.22 1.21   No results for input(s): TROPONINI in the last 72 hours.  Invalid input(s): CK, MB Hepatic Function Panel Recent Labs    02/16/18 0423  PROT 5.7*  ALBUMIN 3.0*  AST 24  ALT 27  ALKPHOS 32*  BILITOT 0.5   No results for input(s): CHOL in the last 72 hours. No results for input(s): PROTIME in the last 72 hours.  Imaging: Imaging results have been reviewed and Dg Chest Port 1 View  Result Date: 02/18/2018 CLINICAL DATA:  Status post aortic valve  replacement. EXAM: PORTABLE CHEST 1 VIEW COMPARISON:  02/17/2018 FINDINGS: Pulmonary artery catheter has been removed. The right jugular central line is still present. Mediastinal drains have been removed. Prosthetic aortic valve is noted. Increased densities at the left lung base suggestive for volume loss and probably pleural fluid. Question a small right pleural effusion. Again noted is lucency in the right upper abdomen suggestive for free intraperitoneal air. Findings are similar to the prior examination. Cardiac silhouette is grossly stable and enlarged. Negative for a pneumothorax. IMPRESSION: 1. Persistent lucency in the right upper abdomen. Findings are compatible with free intraperitoneal air. 2. Increased densities at both lung bases, left side greater than right. Findings are suggestive for volume loss and suspect small pleural effusions. 3. Removal of chest drains without pneumothorax. Electronically Signed   By: Markus Daft M.D.   On: 02/18/2018 08:38   Dg Chest Port 1 View  Result Date: 02/17/2018 CLINICAL DATA:  Status post aortic valve repair EXAM: PORTABLE CHEST 1 VIEW COMPARISON:  02/16/2018 FINDINGS: Cardiac shadow remains enlarged. Postsurgical changes are again seen. Nasogastric catheter and endotracheal tube have been removed in the interval. Swan-Ganz catheter and mediastinal drains are again seen and stable. Lucency is noted at the right lung base. Possibility of underlying free air could not be totally excluded. Positional abdominal films are recommended for further evaluation IMPRESSION: Tubes and  lines as described above. Lucency in the right lung base which may represent some free intraperitoneal air. Positional abdominal films may be helpful for further evaluation. These results will be called to the ordering clinician or representative by the Radiologist Assistant, and communication documented in the PACS or zVision Dashboard. Electronically Signed   By: Inez Catalina M.D.   On:  02/17/2018 08:49   Dg Chest Port 1 View  Result Date: 02/16/2018 CLINICAL DATA:  S/p AVR ETT present EXAM: PORTABLE CHEST 1 VIEW COMPARISON:  02/14/2018 FINDINGS: Endotracheal tube is in place, tip approximately 13 millimeters above the carina. A nasogastric tube is in place, tip beyond the gastroesophageal junction off the image. A RIGHT IJ Swan-Ganz catheter tip overlies the level of the RIGHT main pulmonary artery. Mediastinal drains are present. There is perihilar atelectasis bilaterally. No pulmonary edema. LEFT mid chest is partially obscured by overlying support apparatus. Status post median sternotomy and aortic valve replacement. IMPRESSION: Postoperative appearance of the chest. Perihilar atelectasis.  No pneumothorax. Electronically Signed   By: Nolon Nations M.D.   On: 02/16/2018 12:59    Cardiac Studies:  Assessment/Plan:  Critical aortic stenosis status post aVR with bioprosthetic valve postop day 2 doing well Status post syncope CAD Hypertension Chronic kidney disease stage II Plan Continue present management per CVTS.  Increase ambulation as tolerated  LOS: 7 days    Charolette Forward 02/18/2018, 11:25 AM

## 2018-02-18 NOTE — Progress Notes (Signed)
Patient ID: Jon Ferguson, male   DOB: Apr 25, 1941, 77 y.o.   MRN: [id number] EVENING ROUNDS NOTE :     [address]       [place] [address]             [phone]                 2 Days Post-Op Procedure(s) (LRB): AORTIC VALVE REPLACEMENT (AVR). 43mm EDWARDS LIFESCIENCE INSPIRIS VALVE. (N/A) TRANSESOPHAGEAL ECHOCARDIOGRAM (TEE) (N/A)  Total Length of Stay:  LOS: 7 days  BP 129/83   Pulse 70   Temp (!) 97.5 F (36.4 C) (Oral)   Resp (!) 24   Ht 5\' 10"  (1.778 m)   Wt 82.1 kg   SpO2 (!) 89%   BMI 25.97 kg/m   .Intake/Output      10/12 0701 - 10/13 0700 10/13 0701 - 10/14 0700   P.O. 660 480   I.V. (mL/kg) 127.5 (1.6)    Blood     IV Piggyback 200 100   Total Intake(mL/kg) 987.5 (12) 580 (7.1)   Urine (mL/kg/hr) 825 (0.4) 175 (0.2)   Stool  0   Chest Tube 20    Total Output 845 175   Net +142.5 +405        Urine Occurrence  1 x   Stool Occurrence  3 x     . sodium chloride       Lab Results  Component Value Date   WBC 13.1 (H) 02/18/2018   HGB 11.0 (L) 02/18/2018   HCT 35.0 (L) 02/18/2018   PLT 181 02/18/2018   GLUCOSE 132 (H) 02/18/2018   ALT 27 02/16/2018   AST 24 02/16/2018   NA 136 02/18/2018   K 4.5 02/18/2018   CL 104 02/18/2018   CREATININE 1.21 02/18/2018   BUN 26 (H) 02/18/2018   CO2 24 02/18/2018   INR 1.48 02/16/2018   HGBA1C 5.0 02/14/2018   Stable day , waiting for 4e    Grace Isaac MD  Beeper [phone] Office [phone] 02/18/2018 6:36 PM

## 2018-02-18 NOTE — Progress Notes (Signed)
Patient ID: Jon Ferguson, male   DOB: 1940-10-17, 77 y.o.   MRN: [id number] TCTS DAILY ICU PROGRESS NOTE                   [address]            [place] [address]          [phone]   2 Days Post-Op Procedure(s) (LRB): AORTIC VALVE REPLACEMENT (AVR). 46mm EDWARDS LIFESCIENCE INSPIRIS VALVE. (N/A) TRANSESOPHAGEAL ECHOCARDIOGRAM (TEE) (N/A)  Total Length of Stay:  LOS: 7 days   Subjective: Patient feels well this morning, walked around the unit without difficulty, holding sinus  Objective: Vital signs in last 24 hours: Temp:  [98 F (36.7 C)-99 F (37.2 C)] 98 F (36.7 C) (10/13 0800) Pulse Rate:  [70-95] 70 (10/13 0400) Cardiac Rhythm: Normal sinus rhythm;Heart block (10/13 0800) Resp:  [12-26] 17 (10/13 0800) BP: (111-176)/(71-114) 139/93 (10/13 0800) SpO2:  [91 %-98 %] 96 % (10/13 0800) Weight:  [82.1 kg] 82.1 kg (10/13 0500)  Filed Weights   02/16/18 0408 02/17/18 0600 02/18/18 0500  Weight: 78.9 kg 82.8 kg 82.1 kg    Weight change: -0.7 kg   Hemodynamic parameters for last 24 hours: PAP: (32-43)/(12-19) 43/19  Intake/Output from previous day: 10/12 0701 - 10/13 0700 In: 987.5 [P.O.:660; I.V.:127.5; IV Piggyback:200] Out: 845 [Urine:825; Chest Tube:20]  Intake/Output this shift: Total I/O In: 340 [P.O.:240; IV Piggyback:100] Out: -   Current Meds: Scheduled Meds: . acetaminophen  1,000 mg Oral Q6H   Or  . acetaminophen (TYLENOL) oral liquid 160 mg/5 mL  1,000 mg Per Tube Q6H  . aspirin EC  325 mg Oral Daily   Or  . aspirin  324 mg Per Tube Daily  . bisacodyl  10 mg Oral Daily   Or  . bisacodyl  10 mg Rectal Daily  . docusate sodium  200 mg Oral Daily  . enoxaparin (LOVENOX) injection  30 mg Subcutaneous QHS  . insulin aspart  0-24 Units Subcutaneous TID AC & HS  . lisinopril  5 mg Oral Daily  . metoprolol tartrate  25 mg Oral BID   Or  . metoprolol tartrate  25 mg Per Tube BID  . pantoprazole  40 mg Oral Daily  . sodium  chloride flush  3 mL Intravenous Q12H   Continuous Infusions: . sodium chloride Stopped (02/17/18 1029)  . sodium chloride    . sodium chloride Stopped (02/17/18 2358)  . dexmedetomidine (PRECEDEX) IV infusion Stopped (02/16/18 1543)  . lactated ringers    . lactated ringers Stopped (02/16/18 1726)  . nitroGLYCERIN Stopped (02/16/18 1246)  . phenylephrine (NEO-SYNEPHRINE) Adult infusion Stopped (02/16/18 1232)   PRN Meds:.sodium chloride, metoprolol tartrate, midazolam, morphine injection, ondansetron (ZOFRAN) IV, oxyCODONE, sodium chloride flush, traMADol  General appearance: alert, cooperative, appears stated age and no distress Neurologic: intact Heart: regular rate and rhythm, S1, S2 normal, no murmur, click, rub or gallop Lungs: diminished breath sounds bibasilar Abdomen: soft, non-tender; bowel sounds normal; no masses,  no organomegaly Extremities: extremities normal, atraumatic, no cyanosis or edema and Homans sign is negative, no sign of DVT Wound: Sternum stable  Lab Results: CBC: Recent Labs    02/17/18 1647 02/18/18 0337  WBC 13.7* 13.1*  HGB 11.6* 11.0*  HCT 36.4* 35.0*  PLT 184 181   BMET:  Recent Labs    02/17/18 0420 02/17/18 1639 02/17/18 1647 02/18/18 0337  NA 138 139  --  136  K 4.5 4.5  --  4.5  CL 108 102  --  104  CO2 22  --   --  24  GLUCOSE 141* 121*  --  132*  BUN 20 28*  --  26*  CREATININE 1.14 1.20 1.22 1.21  CALCIUM 8.3*  --   --  8.6*    CMET: Lab Results  Component Value Date   WBC 13.1 (H) 02/18/2018   HGB 11.0 (L) 02/18/2018   HCT 35.0 (L) 02/18/2018   PLT 181 02/18/2018   GLUCOSE 132 (H) 02/18/2018   ALT 27 02/16/2018   AST 24 02/16/2018   NA 136 02/18/2018   K 4.5 02/18/2018   CL 104 02/18/2018   CREATININE 1.21 02/18/2018   BUN 26 (H) 02/18/2018   CO2 24 02/18/2018   INR 1.48 02/16/2018   HGBA1C 5.0 02/14/2018      PT/INR:  Recent Labs    02/16/18 1221  LABPROT 17.8*  INR 1.48   Radiology: Dg Chest Port  1 View  Result Date: 02/18/2018 CLINICAL DATA:  Status post aortic valve replacement. EXAM: PORTABLE CHEST 1 VIEW COMPARISON:  02/17/2018 FINDINGS: Pulmonary artery catheter has been removed. The right jugular central line is still present. Mediastinal drains have been removed. Prosthetic aortic valve is noted. Increased densities at the left lung base suggestive for volume loss and probably pleural fluid. Question a small right pleural effusion. Again noted is lucency in the right upper abdomen suggestive for free intraperitoneal air. Findings are similar to the prior examination. Cardiac silhouette is grossly stable and enlarged. Negative for a pneumothorax. IMPRESSION: 1. Persistent lucency in the right upper abdomen. Findings are compatible with free intraperitoneal air. 2. Increased densities at both lung bases, left side greater than right. Findings are suggestive for volume loss and suspect small pleural effusions. 3. Removal of chest drains without pneumothorax. Electronically Signed   By: Markus Daft M.D.   On: 02/18/2018 08:38     Assessment/Plan: S/P Procedure(s) (LRB): AORTIC VALVE REPLACEMENT (AVR). 22mm EDWARDS LIFESCIENCE INSPIRIS VALVE. (N/A) TRANSESOPHAGEAL ECHOCARDIOGRAM (TEE) (N/A) Mobilize Plan for transfer to step-down: see transfer orders Holding sinus rhythm Renal function stable    Grace Isaac 02/18/2018 9:32 AM

## 2018-02-18 NOTE — Plan of Care (Signed)

## 2018-02-18 NOTE — Progress Notes (Signed)
PRN metoprolol given. See MAR.

## 2018-02-19 ENCOUNTER — Inpatient Hospital Stay (HOSPITAL_COMMUNITY): Payer: Medicare Other

## 2018-02-19 ENCOUNTER — Encounter (HOSPITAL_COMMUNITY): Payer: Self-pay | Admitting: Thoracic Surgery (Cardiothoracic Vascular Surgery)

## 2018-02-19 LAB — CBC
HCT: 31.9 % — ABNORMAL LOW (ref 39.0–52.0)
Hemoglobin: 10.3 g/dL — ABNORMAL LOW (ref 13.0–17.0)
MCH: 31.2 pg (ref 26.0–34.0)
MCHC: 32.3 g/dL (ref 30.0–36.0)
MCV: 96.7 fL (ref 80.0–100.0)
Platelets: 189 10*3/uL (ref 150–400)
RBC: 3.3 MIL/uL — ABNORMAL LOW (ref 4.22–5.81)
RDW: 13.2 % (ref 11.5–15.5)
WBC: 10.5 10*3/uL (ref 4.0–10.5)
nRBC: 0 % (ref 0.0–0.2)

## 2018-02-19 LAB — GLUCOSE, CAPILLARY
GLUCOSE-CAPILLARY: 135 mg/dL — AB (ref 70–99)
GLUCOSE-CAPILLARY: 129 mg/dL — AB (ref 70–99)
Glucose-Capillary: 120 mg/dL — ABNORMAL HIGH (ref 70–99)
Glucose-Capillary: 90 mg/dL (ref 70–99)

## 2018-02-19 LAB — BASIC METABOLIC PANEL
Anion gap: 6 (ref 5–15)
BUN: 26 mg/dL — ABNORMAL HIGH (ref 8–23)
CO2: 24 mmol/L (ref 22–32)
Calcium: 7.9 mg/dL — ABNORMAL LOW (ref 8.9–10.3)
Chloride: 109 mmol/L (ref 98–111)
Creatinine, Ser: 1.18 mg/dL (ref 0.61–1.24)
GFR calc Af Amer: >60 mL/min (ref >60)
GFR calc non Af Amer: 59 mL/min — ABNORMAL LOW (ref >60)
Glucose, Bld: 124 mg/dL — ABNORMAL HIGH (ref 70–99)
Potassium: 3.7 mmol/L (ref 3.5–5.1)
Sodium: 139 mmol/L (ref 135–145)

## 2018-02-19 MED ORDER — ENOXAPARIN SODIUM 40 MG/0.4ML ~~LOC~~ SOLN
40.0000 mg | Freq: Every day | SUBCUTANEOUS | Status: DC
  Administered 2018-02-19 – 2018-02-21 (×3): 40 mg via SUBCUTANEOUS
  Filled 2018-02-19 (×3): qty 0.4

## 2018-02-19 MED ORDER — AMIODARONE HCL 200 MG PO TABS: 200.0000 mg | ORAL_TABLET | Freq: Two times a day (BID) | ORAL | Status: DC

## 2018-02-19 MED ORDER — AMIODARONE HCL 200 MG PO TABS: 200.0000 mg | ORAL_TABLET | Freq: Every day | ORAL | Status: DC

## 2018-02-19 MED ORDER — AMIODARONE HCL 200 MG PO TABS
200.0000 mg | ORAL_TABLET | Freq: Two times a day (BID) | ORAL | Status: DC
  Administered 2018-02-20: 200 mg via ORAL
  Filled 2018-02-19: qty 1

## 2018-02-19 MED ORDER — AMIODARONE LOAD VIA INFUSION
150.0000 mg | Freq: Once | INTRAVENOUS | Status: AC
  Administered 2018-02-19: 150 mg via INTRAVENOUS
  Filled 2018-02-19: qty 83.34

## 2018-02-19 MED ORDER — ALUM & MAG HYDROXIDE-SIMETH 200-200-20 MG/5ML PO SUSP: 15.0000 mL | Freq: Four times a day (QID) | ORAL | Status: DC | PRN

## 2018-02-19 MED ORDER — POTASSIUM CHLORIDE CRYS ER 20 MEQ PO TBCR
30.0000 meq | EXTENDED_RELEASE_TABLET | Freq: Once | ORAL | Status: AC
  Administered 2018-02-19: 30 meq via ORAL
  Filled 2018-02-19: qty 1

## 2018-02-19 MED ORDER — AMIODARONE HCL IN DEXTROSE 360-4.14 MG/200ML-% IV SOLN
30.0000 mg/h | INTRAVENOUS | Status: AC
  Administered 2018-02-19: 30 mg/h via INTRAVENOUS
  Filled 2018-02-19 (×2): qty 200

## 2018-02-19 MED ORDER — AMIODARONE HCL IN DEXTROSE 360-4.14 MG/200ML-% IV SOLN
60.0000 mg/h | INTRAVENOUS | Status: AC
  Administered 2018-02-19 (×2): 60 mg/h via INTRAVENOUS
  Filled 2018-02-19 (×2): qty 200

## 2018-02-19 MED FILL — Potassium Chloride Inj 2 mEq/ML: INTRAVENOUS | Qty: 40 | Status: AC

## 2018-02-19 MED FILL — Magnesium Sulfate Inj 50%: INTRAMUSCULAR | Qty: 10 | Status: AC

## 2018-02-19 MED FILL — Heparin Sodium (Porcine) Inj 1000 Unit/ML: INTRAMUSCULAR | Qty: 2500 | Status: AC

## 2018-02-19 MED FILL — Heparin Sodium (Porcine) Inj 1000 Unit/ML: INTRAMUSCULAR | Qty: 30 | Status: AC

## 2018-02-19 NOTE — Progress Notes (Signed)
3 Days Post-Op Procedure(s) (LRB): AORTIC VALVE REPLACEMENT (AVR). 103mm EDWARDS LIFESCIENCE INSPIRIS VALVE. (N/A) TRANSESOPHAGEAL ECHOCARDIOGRAM (TEE) (N/A) Subjective: No complaints this AM  Objective: Vital signs in last 24 hours: Temp:  [97.5 F (36.4 C)-99.5 F (37.5 C)] 98.2 F (36.8 C) (10/14 0400) Pulse Rate:  [71-91] 73 (10/14 0500) Cardiac Rhythm: Atrial fibrillation (10/14 0400) Resp:  [10-24] 22 (10/14 0700) BP: (97-146)/(65-93) 97/65 (10/14 0700) SpO2:  [92 %-99 %] 99 % (10/14 0700) Weight:  [81.2 kg] 81.2 kg (10/14 0400)  Hemodynamic parameters for last 24 hours:    Intake/Output from previous day: 10/13 0701 - 10/14 0700 In: 1044.5 [P.O.:770; I.V.:174.5; IV Piggyback:100] Out: 775 [Urine:775] Intake/Output this shift: No intake/output data recorded.  General appearance: alert, cooperative and no distress Neurologic: intact Heart: irregularly irregular rhythm Lungs: diminished breath sounds bibasilar Wound: clean and dry  Lab Results: Recent Labs    02/18/18 0337 02/19/18 0310  WBC 13.1* 10.5  HGB 11.0* 10.3*  HCT 35.0* 31.9*  PLT 181 189   BMET:  Recent Labs    02/18/18 0337 02/19/18 0310  NA 136 139  K 4.5 3.7  CL 104 109  CO2 24 24  GLUCOSE 132* 124*  BUN 26* 26*  CREATININE 1.21 1.18  CALCIUM 8.6* 7.9*    PT/INR:  Recent Labs    02/16/18 1221  LABPROT 17.8*  INR 1.48   ABG    Component Value Date/Time   PHART 7.331 (L) 02/16/2018 1750   HCO3 22.7 02/16/2018 1750   TCO2 24 02/17/2018 1639   ACIDBASEDEF 3.0 (H) 02/16/2018 1750   O2SAT 98.0 02/16/2018 1750   CBG (last 3)  Recent Labs    02/18/18 1138 02/18/18 1603 02/19/18 0650  GLUCAP 109* 110* 90    Assessment/Plan: S/P Procedure(s) (LRB): AORTIC VALVE REPLACEMENT (AVR). 69mm EDWARDS LIFESCIENCE INSPIRIS VALVE. (N/A) TRANSESOPHAGEAL ECHOCARDIOGRAM (TEE) (N/A) Plan for transfer to step-down: see transfer orders  Awaiting bed on 4E CV- went into rate controlled  atrial fib overnight- started on IV amiodarone-   Continue metoprolol, IV amiodarone- transition to PO amio tomorrow  ASA only for now, will need to consider anticoagulation if atrial fib persists  RESP- bibasilar atelectasis- IS  RENAL- creatinine OK, supplement K  ENDO- CBG well controlled- dc CBG, SSI  Anemia secondary to ABL- follow  Continue cardiac rehab   LOS: 8 days    [name] 02/19/2018

## 2018-02-19 NOTE — Progress Notes (Signed)
Ref: Patient, No Pcp Per   Subjective:  Feeling better. No chest pain. Monitor atrial fibrillation with controlled ventricular response.  Objective:  Vital Signs in the last 24 hours: Temp:  [97.5 F (36.4 C)-99.5 F (37.5 C)] 98.2 F (36.8 C) (10/14 0400) Pulse Rate:  [67-91] 67 (10/14 0808) Cardiac Rhythm: Atrial fibrillation (10/14 0800) Resp:  [10-24] 17 (10/14 1000) BP: (93-146)/(61-86) 108/80 (10/14 1000) SpO2:  [92 %-99 %] 97 % (10/14 1000) Weight:  [81.2 kg] 81.2 kg (10/14 0400)  Physical Exam: BP Readings from Last 1 Encounters:  02/19/18 108/80     Wt Readings from Last 1 Encounters:  02/19/18 81.2 kg    Weight change: -0.9 kg Body mass index is 25.69 kg/m. HEENT: [place]/AT, Eyes-Blue, PERL, EOMI, Conjunctiva-Pink, Sclera-Non-icteric Neck: No JVD, No bruit, Trachea midline. Lungs:  Clear, Bilateral. Midline scar healing well. Cardiac:  Regular rhythm, normal S1 and S2, no S3. II/VI systolic murmur, soft, musical character. Abdomen:  Soft, non-tender. BS present. Extremities:  No edema present. No cyanosis. No clubbing. CNS: AxOx3, Cranial nerves grossly intact, moves all 4 extremities.  Skin: Warm and dry.   Intake/Output from previous day: 10/13 0701 - 10/14 0700 In: 1044.5 [P.O.:770; I.V.:174.5; IV Piggyback:100] Out: 775 [Urine:775]    Lab Results: BMET    Component Value Date/Time   NA 139 02/19/2018 0310   NA 136 02/18/2018 0337   NA 139 02/17/2018 1639   K 3.7 02/19/2018 0310   K 4.5 02/18/2018 0337   K 4.5 02/17/2018 1639   CL 109 02/19/2018 0310   CL 104 02/18/2018 0337   CL 102 02/17/2018 1639   CO2 24 02/19/2018 0310   CO2 24 02/18/2018 0337   CO2 22 02/17/2018 0420   GLUCOSE 124 (H) 02/19/2018 0310   GLUCOSE 132 (H) 02/18/2018 0337   GLUCOSE 121 (H) 02/17/2018 1639   BUN 26 (H) 02/19/2018 0310   BUN 26 (H) 02/18/2018 0337   BUN 28 (H) 02/17/2018 1639   CREATININE 1.18 02/19/2018 0310   CREATININE 1.21 02/18/2018 0337   CREATININE  1.22 02/17/2018 1647   CALCIUM 7.9 (L) 02/19/2018 0310   CALCIUM 8.6 (L) 02/18/2018 0337   CALCIUM 8.3 (L) 02/17/2018 0420   GFRNONAA 59 (L) 02/19/2018 0310   GFRNONAA 57 (L) 02/18/2018 0337   GFRNONAA 56 (L) 02/17/2018 1647   GFRAA >60 02/19/2018 0310   GFRAA >60 02/18/2018 0337   GFRAA >60 02/17/2018 1647   CBC    Component Value Date/Time   WBC 10.5 02/19/2018 0310   RBC 3.30 (L) 02/19/2018 0310   HGB 10.3 (L) 02/19/2018 0310   HCT 31.9 (L) 02/19/2018 0310   PLT 189 02/19/2018 0310   MCV 96.7 02/19/2018 0310   MCH 31.2 02/19/2018 0310   MCHC 32.3 02/19/2018 0310   RDW 13.2 02/19/2018 0310   LYMPHSABS 0.6 (L) 02/16/2018 1739   MONOABS 0.6 02/16/2018 1739   EOSABS 0.0 02/16/2018 1739   BASOSABS 0.0 02/16/2018 1739   HEPATIC Function Panel Recent Labs    02/16/18 0423  PROT 5.7*   HEMOGLOBIN A1C No components found for: HGA1C,  MPG CARDIAC ENZYMES No results found for: CKTOTAL, CKMB, CKMBINDEX, TROPONINI BNP No results for input(s): PROBNP in the last 8760 hours. TSH No results for input(s): TSH in the last 8760 hours. CHOLESTEROL No results for input(s): CHOL in the last 8760 hours.  Scheduled Meds: . amiodarone  200 mg Oral Q12H   Followed by  . [START ON 02/26/2018] amiodarone  200 mg Oral Daily  . aspirin EC  325 mg Oral Daily  . docusate sodium  200 mg Oral Daily  . enoxaparin (LOVENOX) injection  40 mg Subcutaneous QHS  . lisinopril  5 mg Oral Daily  . metoprolol tartrate  25 mg Oral BID   Or  . metoprolol tartrate  25 mg Per Tube BID  . sodium chloride flush  3 mL Intravenous Q12H   Continuous Infusions: . sodium chloride    . amiodarone 30 mg/hr (02/19/18 1000)   PRN Meds:.sodium chloride, acetaminophen, alum & mag hydroxide-simeth, bisacodyl **OR** bisacodyl, guaiFENesin, ondansetron **OR** ondansetron (ZOFRAN) IV, oxyCODONE, sodium chloride flush, traMADol  Assessment/Plan: Bioprosthetic AV S/P Severe AS S/P Syncope CAD HTN CKD,  II Atrial fibrillation, CHA2DS2VASc score of 2   Agree with amiodarone use along with metoprolol for rate control. Anticoagulation when OK with surgery.   LOS: 8 days    Dixie Dials  MD  02/19/2018, 10:22 AM

## 2018-02-19 NOTE — Progress Notes (Signed)
CT surgery p.m. Rounds  Patient resting comfortably in recliner chair Sinus rhythm on IV amiodarone Had walks in hallway today Waiting for transfer to stepdown

## 2018-02-19 NOTE — Progress Notes (Signed)
  Amiodarone Drug - Drug Interaction Consult Note  Recommendations: Monitor any Zofran use, HR Amiodarone is metabolized by the cytochrome P450 system and therefore has the potential to cause many drug interactions. Amiodarone has an average plasma half-life of 50 days (range 20 to 100 days).   There is potential for drug interactions to occur several weeks or months after stopping treatment and the onset of drug interactions may be slow after initiating amiodarone.   []  Statins: Increased risk of myopathy. Simvastatin- restrict dose to 20mg  daily. Other statins: counsel patients to report any muscle pain or weakness immediately.  []  Anticoagulants: Amiodarone can increase anticoagulant effect. Consider warfarin dose reduction. Patients should be monitored closely and the dose of anticoagulant altered accordingly, remembering that amiodarone levels take several weeks to stabilize.  []  Antiepileptics: Amiodarone can increase plasma concentration of phenytoin, the dose should be reduced. Note that small changes in phenytoin dose can result in large changes in levels. Monitor patient and counsel on signs of toxicity.  [x]  Beta blockers: increased risk of bradycardia, AV block and myocardial depression. Sotalol - avoid concomitant use.  []   Calcium channel blockers (diltiazem and verapamil): increased risk of bradycardia, AV block and myocardial depression.  []   Cyclosporine: Amiodarone increases levels of cyclosporine. Reduced dose of cyclosporine is recommended.  []  Digoxin dose should be halved when amiodarone is started.  []  Diuretics: increased risk of cardiotoxicity if hypokalemia occurs.  []  Oral hypoglycemic agents (glyburide, glipizide, glimepiride): increased risk of hypoglycemia. Patient's glucose levels should be monitored closely when initiating amiodarone therapy.   [x]  Drugs that prolong the QT interval:  Torsades de pointes risk may be increased with concurrent use - avoid if  possible.  Monitor QTc, also keep magnesium/potassium WNL if concurrent therapy can't be avoided. Marland Kitchen Antibiotics: e.g. fluoroquinolones, erythromycin. . Antiarrhythmics: e.g. quinidine, procainamide, disopyramide, sotalol. . Antipsychotics: e.g. phenothiazines, haloperidol.  . Lithium, tricyclic antidepressants, and methadone. Thank Dennis Bast  E[place], Dobie  02/19/2018 2:47 AM    '

## 2018-02-19 NOTE — Plan of Care (Signed)
Progressing well with the ability to show understanding of post op care and restrictions

## 2018-02-19 NOTE — Progress Notes (Signed)
CRITICAL VALUE ALERT  Critical Value:  Rhythm changed from sinus rhythm to atrial fibrillation.  Provider Notified: Dr.Gerhardt, 02/19/18 0237  Orders Received/Actions taken: Orders receive to begin amiodarone protocol. See MAR. Protocol began.

## 2018-02-20 LAB — BASIC METABOLIC PANEL
Anion gap: 10 (ref 5–15)
BUN: 29 mg/dL — AB (ref 8–23)
CO2: 24 mmol/L (ref 22–32)
CREATININE: 1.32 mg/dL — AB (ref 0.61–1.24)
Calcium: 8.3 mg/dL — ABNORMAL LOW (ref 8.9–10.3)
Chloride: 105 mmol/L (ref 98–111)
GFR calc non Af Amer: 51 mL/min — ABNORMAL LOW (ref >60)
GFR, EST AFRICAN AMERICAN: 59 mL/min — AB (ref >60)
Glucose, Bld: 102 mg/dL — ABNORMAL HIGH (ref 70–99)
Potassium: 3.7 mmol/L (ref 3.5–5.1)
Sodium: 139 mmol/L (ref 135–145)

## 2018-02-20 MED ORDER — AMIODARONE HCL 200 MG PO TABS
400.0000 mg | ORAL_TABLET | Freq: Two times a day (BID) | ORAL | Status: DC
  Administered 2018-02-20 – 2018-02-21 (×3): 400 mg via ORAL
  Filled 2018-02-20 (×4): qty 2

## 2018-02-20 MED ORDER — POTASSIUM CHLORIDE ER 10 MEQ PO TBCR
20.0000 meq | EXTENDED_RELEASE_TABLET | Freq: Two times a day (BID) | ORAL | Status: AC
  Administered 2018-02-20 (×2): 20 meq via ORAL
  Filled 2018-02-20 (×4): qty 2

## 2018-02-20 MED ORDER — GLUCERNA SHAKE PO LIQD
237.0000 mL | Freq: Three times a day (TID) | ORAL | Status: DC
  Administered 2018-02-20 – 2018-02-22 (×4): 237 mL via ORAL

## 2018-02-20 MED ORDER — ADULT MULTIVITAMIN W/MINERALS CH
1.0000 | ORAL_TABLET | Freq: Every day | ORAL | Status: DC
  Administered 2018-02-20 – 2018-02-22 (×3): 1 via ORAL
  Filled 2018-02-20 (×3): qty 1

## 2018-02-20 MED ORDER — AMIODARONE HCL 200 MG PO TABS: 200.0000 mg | ORAL_TABLET | Freq: Every day | ORAL | Status: DC

## 2018-02-20 MED FILL — Heparin Sodium (Porcine) Inj 1000 Unit/ML: INTRAMUSCULAR | Qty: 30 | Status: AC

## 2018-02-20 MED FILL — Sodium Chloride IV Soln 0.9%: INTRAVENOUS | Qty: 2000 | Status: AC

## 2018-02-20 MED FILL — Electrolyte-R (PH 7.4) Solution: INTRAVENOUS | Qty: 4000 | Status: AC

## 2018-02-20 MED FILL — Mannitol IV Soln 20%: INTRAVENOUS | Qty: 500 | Status: AC

## 2018-02-20 MED FILL — Lidocaine HCl(Cardiac) IV PF Soln Pref Syr 100 MG/5ML (2%): INTRAVENOUS | Qty: 5 | Status: AC

## 2018-02-20 MED FILL — Sodium Bicarbonate IV Soln 8.4%: INTRAVENOUS | Qty: 50 | Status: AC

## 2018-02-20 NOTE — Progress Notes (Addendum)
Nutrition Follow-up  DOCUMENTATION CODES:   Not applicable  INTERVENTION:   -Downgrade diet to dysphagia 3 (advanced mechanical soft) diet, for ease of intake (pt complaining of difficulty chewing and continued bottom gum pain s/p multiple dental extractions) -MVI with minerals daily -Glucerna Shake po TID, each supplement provides 220 kcal and 10 grams of protein  NUTRITION DIAGNOSIS:   Increased nutrient needs related to post-op healing as evidenced by estimated needs.  Ongoing  GOAL:   Patient will meet greater than or equal to 90% of their needs  Progressing  MONITOR:   PO intake, Supplement acceptance, Labs, Weight trends, Skin, I & O's  REASON FOR ASSESSMENT:   Consult Other (Comment)(pt is edentulous)  ASSESSMENT:   77 yo Male who was transferred from [facility] in [place], [place] for severe aortic valve stenosis and syncope.  10/8 cardiac cath 10/9 multiple tooth extractions with alveoloplasty  10/11- intubated/extubated, s/p aortic valve replacement and TEE  Reviewed I/O's: +407 ml  x24 hours and +3.8 L since admission  Spoke with pt, who was sitting in recliner chair at time of visit. Pt is in good spirits and very pleased with his progress. He reports continued good appetite; estimating consuming at least 60% of meals. He consumed all of his oatmeal and eggs for breakfast this AM, per his report. Noted meal completion 25-100%. Pt reports difficulty chewing harder textured foods, such as meats, due to recent dental extractions and pain in the bottom of his mouth. Pt amenable to mechanically downgrade diet texture for ease of intake.   Pt reports consuming nutritional supplements when offered, however, none ordered currently. Discussed with pt importance of good meal and supplement intake to promote healing.   Per MD notes, pt awating SDU transfer.   Labs reviewed: CBGS: 90-129  NUTRITION - FOCUSED PHYSICAL EXAM:    Most Recent Value  Orbital Region   No depletion  Upper Arm Region  Mild depletion  Thoracic and Lumbar Region  No depletion  Buccal Region  No depletion  Temple Region  No depletion  Clavicle Bone Region  Mild depletion  Clavicle and Acromion Bone Region  No depletion  Scapular Bone Region  No depletion  Dorsal Hand  Mild depletion  Patellar Region  No depletion  Anterior Thigh Region  No depletion  Posterior Calf Region  No depletion  Edema (RD Assessment)  None  Hair  Reviewed  Eyes  Reviewed  Mouth  Reviewed  Skin  Reviewed  Nails  Reviewed       Diet Order:   Diet Order            Diet heart healthy/carb modified Room service appropriate? Yes; Fluid consistency: Thin  Diet effective now              EDUCATION NEEDS:   No education needs have been identified at this time  Skin:  Skin Assessment: Skin Integrity Issues: Skin Integrity Issues:: Incisions Incisions: closed chest  Last BM:  02/19/18  Height:   Ht Readings from Last 1 Encounters:  02/16/18 5\' 10"  (1.778 m)    Weight:   Wt Readings from Last 1 Encounters:  02/20/18 83.6 kg    Ideal Body Weight:  75.5 kg  BMI:  Body mass index is 26.44 kg/m.  Estimated Nutritional Needs:   Kcal:  2000-2200  Protein:  100-115 gm  Fluid:  2.0-2.2 L    Havier Deeb A. Jimmye Norman, RD, LDN, CDE Pager: [phone] After hours Pager: [phone]

## 2018-02-20 NOTE — Progress Notes (Signed)
Ref: Patient, No Pcp Per   Subjective:  Resting comfortably. Occasional runs of a. Fib, mostly sinus rhythm. T max 99 degree F. BP 119/79.  Objective:  Vital Signs in the last 24 hours: Temp:  [97.2 F (36.2 C)-99 F (37.2 C)] 97.2 F (36.2 C) (10/15 0426) Pulse Rate:  [75] 75 (10/15 0913) Cardiac Rhythm: Normal sinus rhythm (10/15 0800) Resp:  [16-27] 23 (10/15 1200) BP: (90-141)/(54-90) 119/79 (10/15 1200) SpO2:  [88 %-98 %] 97 % (10/15 0800) Weight:  [83.6 kg] 83.6 kg (10/15 0600)  Physical Exam: BP Readings from Last 1 Encounters:  02/20/18 119/79     Wt Readings from Last 1 Encounters:  02/20/18 83.6 kg    Weight change: 2.4 kg Body mass index is 26.44 kg/m. HEENT: [place]/AT, Eyes-Bl;ue, PERL, EOMI, Conjunctiva-Pink, Sclera-Non-icteric Neck: No JVD, No bruit, Trachea midline. Lungs:  Clear, Bilateral. Cardiac:  Regular rhythm, normal S1 and S2, no S3.  Abdomen:  Soft, non-tender. BS present. Extremities:  No edema present. No cyanosis. No clubbing. CNS: AxOx3, Cranial nerves grossly intact, moves all 4 extremities.  Skin: Warm and dry.   Intake/Output from previous day: 10/14 0701 - 10/15 0700 In: 407.1 [I.V.:407.1] Out: -     Lab Results: BMET    Component Value Date/Time   NA 139 02/20/2018 0243   NA 139 02/19/2018 0310   NA 136 02/18/2018 0337   K 3.7 02/20/2018 0243   K 3.7 02/19/2018 0310   K 4.5 02/18/2018 0337   CL 105 02/20/2018 0243   CL 109 02/19/2018 0310   CL 104 02/18/2018 0337   CO2 24 02/20/2018 0243   CO2 24 02/19/2018 0310   CO2 24 02/18/2018 0337   GLUCOSE 102 (H) 02/20/2018 0243   GLUCOSE 124 (H) 02/19/2018 0310   GLUCOSE 132 (H) 02/18/2018 0337   BUN 29 (H) 02/20/2018 0243   BUN 26 (H) 02/19/2018 0310   BUN 26 (H) 02/18/2018 0337   CREATININE 1.32 (H) 02/20/2018 0243   CREATININE 1.18 02/19/2018 0310   CREATININE 1.21 02/18/2018 0337   CALCIUM 8.3 (L) 02/20/2018 0243   CALCIUM 7.9 (L) 02/19/2018 0310   CALCIUM 8.6 (L)  02/18/2018 0337   GFRNONAA 51 (L) 02/20/2018 0243   GFRNONAA 59 (L) 02/19/2018 0310   GFRNONAA 57 (L) 02/18/2018 0337   GFRAA 59 (L) 02/20/2018 0243   GFRAA >60 02/19/2018 0310   GFRAA >60 02/18/2018 0337   CBC    Component Value Date/Time   WBC 10.5 02/19/2018 0310   RBC 3.30 (L) 02/19/2018 0310   HGB 10.3 (L) 02/19/2018 0310   HCT 31.9 (L) 02/19/2018 0310   PLT 189 02/19/2018 0310   MCV 96.7 02/19/2018 0310   MCH 31.2 02/19/2018 0310   MCHC 32.3 02/19/2018 0310   RDW 13.2 02/19/2018 0310   LYMPHSABS 0.6 (L) 02/16/2018 1739   MONOABS 0.6 02/16/2018 1739   EOSABS 0.0 02/16/2018 1739   BASOSABS 0.0 02/16/2018 1739   HEPATIC Function Panel Recent Labs    02/16/18 0423  PROT 5.7*   HEMOGLOBIN A1C No components found for: HGA1C,  MPG CARDIAC ENZYMES No results found for: CKTOTAL, CKMB, CKMBINDEX, TROPONINI BNP No results for input(s): PROBNP in the last 8760 hours. TSH No results for input(s): TSH in the last 8760 hours. CHOLESTEROL No results for input(s): CHOL in the last 8760 hours.  Scheduled Meds: . amiodarone  200 mg Oral Q12H   Followed by  . [START ON 02/27/2018] amiodarone  200 mg Oral Daily  .  aspirin EC  325 mg Oral Daily  . docusate sodium  200 mg Oral Daily  . enoxaparin (LOVENOX) injection  40 mg Subcutaneous QHS  . feeding supplement (GLUCERNA SHAKE)  237 mL Oral TID BM  . lisinopril  5 mg Oral Daily  . metoprolol tartrate  25 mg Oral BID   Or  . metoprolol tartrate  25 mg Per Tube BID  . multivitamin with minerals  1 tablet Oral Daily  . potassium chloride  20 mEq Oral BID  . sodium chloride flush  3 mL Intravenous Q12H   Continuous Infusions: . sodium chloride    . amiodarone 30 mg/hr (02/20/18 1200)   PRN Meds:.sodium chloride, acetaminophen, alum & mag hydroxide-simeth, bisacodyl **OR** bisacodyl, guaiFENesin, ondansetron **OR** ondansetron (ZOFRAN) IV, oxyCODONE, sodium chloride flush, traMADol  Assessment/Plan: Bioprosthetic AV  placement S/P Severe AS S/P syncope due to above Paroxysmal atrial fib. HTN CKD, II  Increase amiodarone loading dose for few days then back to 200 mg. Daily.   LOS: 9 days    Dixie Dials  MD  02/20/2018, 1:50 PM

## 2018-02-20 NOTE — Progress Notes (Signed)
4 Days Post-Op Procedure(s) (LRB): AORTIC VALVE REPLACEMENT (AVR). 8mm EDWARDS LIFESCIENCE INSPIRIS VALVE. (N/A) TRANSESOPHAGEAL ECHOCARDIOGRAM (TEE) (N/A) Subjective: No complaints this AM  Objective: Vital signs in last 24 hours: Temp:  [97.2 F (36.2 C)-99 F (37.2 C)] 97.2 F (36.2 C) (10/15 0426) Pulse Rate:  [75] 75 (10/15 0913) Cardiac Rhythm: Normal sinus rhythm (10/15 0800) Resp:  [16-27] 17 (10/15 0800) BP: (90-141)/(54-90) 116/83 (10/15 0913) SpO2:  [88 %-98 %] 97 % (10/15 0800) Weight:  [83.6 kg] 83.6 kg (10/15 0600)  Hemodynamic parameters for last 24 hours:    Intake/Output from previous day: 10/14 0701 - 10/15 0700 In: 407.1 [I.V.:407.1] Out: -  Intake/Output this shift: Total I/O In: 19.6 [I.V.:19.6] Out: -   General appearance: alert, cooperative and no distress Neurologic: intact Heart: regular rate and rhythm Lungs: clear to auscultation bilaterally Wound: clean and dry  Lab Results: Recent Labs    02/18/18 0337 02/19/18 0310  WBC 13.1* 10.5  HGB 11.0* 10.3*  HCT 35.0* 31.9*  PLT 181 189   BMET:  Recent Labs    02/19/18 0310 02/20/18 0243  NA 139 139  K 3.7 3.7  CL 109 105  CO2 24 24  GLUCOSE 124* 102*  BUN 26* 29*  CREATININE 1.18 1.32*  CALCIUM 7.9* 8.3*    PT/INR: No results for input(s): LABPROT, INR in the last 72 hours. ABG    Component Value Date/Time   PHART 7.331 (L) 02/16/2018 1750   HCO3 22.7 02/16/2018 1750   TCO2 24 02/17/2018 1639   ACIDBASEDEF 3.0 (H) 02/16/2018 1750   O2SAT 98.0 02/16/2018 1750   CBG (last 3)  Recent Labs    02/18/18 2129 02/19/18 0650 02/19/18 1106  GLUCAP 135* 90 129*    Assessment/Plan: S/P Procedure(s) (LRB): AORTIC VALVE REPLACEMENT (AVR). 18mm EDWARDS LIFESCIENCE INSPIRIS VALVE. (N/A) TRANSESOPHAGEAL ECHOCARDIOGRAM (TEE) (N/A) Awaiting bed on stepdown  CV- postoperative atrial fibrillation- back in SR  No need for anticoagulation unless recurs  RESP- continue IS  RENAL_  creatinine up slightly. Was recently started on ACE-I- recheck in AM  ENDO- CBG well controlled  Continue cardiac rehab   LOS: 9 days    [name] 02/20/2018

## 2018-02-20 NOTE — Discharge Summary (Addendum)
Physician Discharge Summary       [address]       [place] [address]             [phone]    Patient ID: Jon Ferguson MRN: [id number] DOB/AGE: 13-Jul-1940 77 y.o.  Admit date: 02/11/2018 Discharge date: 02/22/2018  Admission Diagnoses: 1. Syncope 2. Severe aortic stenosis  Discharge Diagnoses:  1. S/P AVR 2. ABL anemia 3. Paroxysmal atrial fibrillation 4. CKD (stage II) 5. History of hypertension   Procedure (s): Diagnostic  Dominance: Right  Left Main  Vessel was injected. Vessel is normal in caliber. There is mild focal disease in the vessel. The vessel exhibits minimal luminal irregularities. The vessel is mildly calcified.  Left Anterior Descending  Vessel was injected. Vessel is normal in caliber. The vessel exhibits minimal luminal irregularities. The vessel is mildly calcified.  First Diagonal Branch  Vessel was injected. Vessel is normal in size. The vessel exhibits minimal luminal irregularities.  Ost 1st Diag lesion 50% stenosed  Ost 1st Diag lesion is 50% stenosed. Vessel is not the culprit lesion. The lesion is type A and eccentric. The lesion was not previously treated. The stenosis was measured by a visual reading. Pressure wire/FFR was not performed on the lesion. IVUS was not performed.  Ramus Intermedius  Vessel was injected. Vessel is normal in caliber. The vessel exhibits minimal luminal irregularities.  Left Circumflex  Vessel was injected. Vessel is normal in caliber. The vessel exhibits minimal luminal irregularities.  Right Coronary Artery  Vessel was injected. Vessel is very large. The vessel exhibits minimal luminal irregularities.  Intervention   No interventions have been documented.  Right Heart   Right Heart Pressures LV EDP is normal. Low PA, RVEDP, RA and wedge pressures due to dehydration.  Right Atrium Right atrial pressure is normal                              *[facility]*                         [address], [place] [address]                            [phone]  ------------------------------------------------------------------- Transesophageal Echocardiography  Patient:    Jon Ferguson, Jon Ferguson MR #:       [id number] Study Date: 02/13/2018 Gender:     M Age:        77 Height:     180.3 cm Weight:     78.2 kg BSA:        1.98 m^2 Pt. Status: Room:       [place]    Dixie Dials, MD  ATTENDING    Dixie Dials, MD  ORDERING     Dixie Dials, MD  PERFORMING   Dixie Dials, MD  REFERRING    Dixie Dials, MD  SONOGRAPHER  Dustin Flock, RCS  cc:  ------------------------------------------------------------------- LV EF: 50% -   55%  ------------------------------------------------------------------- Indications:      Aortic stenosis 424.1.  ------------------------------------------------------------------- History:   PMH:  Rheumatic  heart disease.  Aortic valve disease. Mitral valve disease.  Risk factors:  Hypertension.  ------------------------------------------------------------------- Study Conclusions  - Left ventricle: There was mild concentric hypertrophy. Systolic   function was normal. The estimated ejection fraction was in the   range of 50% to 55%. Wall motion was normal; there were no   regional wall motion abnormalities. - Aortic valve: Calcification. Severe diffuse calcification   involving the right coronary, left coronary, and noncoronary   cusp. Cusp separation was severely reduced. Transvalvular   velocity was increased, due to stenosis. There was severe   stenosis. Valve area by plenimetry was 0.89 cm2 and by mean   gradient of 65 mm Hg was 0.54 cm2. There was moderate   regurgitation directed centrally in the LVOT and eccentrically in   the LVOT. - Mitral valve: Mildly calcified annulus. There was mild to   moderate regurgitation, with multiple  jets. - Left atrium: The atrium was mildly dilated. No evidence of   thrombus in the atrial cavity or appendage. - Right atrium: No evidence of thrombus in the atrial cavity or   appendage. - Atrial septum: No defect or patent foramen ovale was identified.  ------------------------------------------------------------------- Study data:   Study status:  Routine.  Consent:  The risks, benefits, and alternatives to the procedure were explained to the patient and informed consent was obtained.  Procedure:  The patient reported no pain pre or post test. Initial setup. The patient was brought to the laboratory. Surface ECG leads were monitored. Sedation. Conscious sedation was administered by cardiology staff. Transesophageal echocardiography. Topical anesthesia was obtained using viscous lidocaine. A transesophageal probe was inserted by the attending cardiologistwithout difficulty. Image quality was adequate. Intravenous contrast (agitated saline) was administered. Study completion:  The patient tolerated the procedure well. There were no complications.  Administered medications:   Midazolam, 3mg , IV.  Fentanyl, 47mcg, IV.          Diagnostic transesophageal echocardiography.  2D and color Doppler.  Birthdate:  Patient birthdate: 03-04-1941.  Age:  Patient is 77 yr old.  Sex:  Gender: male.    BMI: 24.1 kg/m^2.  Blood pressure:     150/103  Patient status:  Inpatient.  Study date:  Study date: 02/13/2018. Study time: 07:35 AM.  Location:  Endoscopy.  -------------------------------------------------------------------  ------------------------------------------------------------------- Left ventricle:  There was mild concentric hypertrophy. Systolic function was normal. The estimated ejection fraction was in the range of 50% to 55%. Wall motion was normal; there were no regional wall motion abnormalities.  ------------------------------------------------------------------- Aortic  valve:   Trileaflet; normal thickness leaflets. Calcification.  Severe diffuse calcification involving the right coronary, left coronary, and noncoronary cusp. Cusp separation was severely reduced.  Doppler:  Transvalvular velocity was increased, due to stenosis. There was severe stenosis. Valve area by plenimetry was 0.89 cm2 and by mean gradient of 65 mm Hg was 0.54 cm2. There was moderate regurgitation directed centrally in the LVOT and eccentrically in the LVOT.    Mean gradient (S): 65 mm Hg. Peak gradient (S): 110 mm Hg.  ------------------------------------------------------------------- Aorta:  There was mild, diffuse atheromatous plaque. There was no evidence for dissection. Aortic root: The aortic root was not dilated. Ascending aorta: The ascending aorta was normal in size. Aortic arch: The aortic arch was normal in size. Descending aorta: The descending aorta was normal in size.  ------------------------------------------------------------------- Mitral valve:   Mildly calcified annulus. Leaflet separation was normal.  Doppler:  There was mild to moderate regurgitation, with multiple jets.  ------------------------------------------------------------------- Left  atrium:  The atrium was mildly dilated.  No evidence of thrombus in the atrial cavity or appendage. The appendage was morphologically a left appendage, multilobulated, and of normal size. Emptying velocity was normal.  ------------------------------------------------------------------- Atrial septum:  No defect or patent foramen ovale was identified.   ------------------------------------------------------------------- Pulmonary veins:  Well visualized.  ------------------------------------------------------------------- Right ventricle:  The cavity size was normal. Wall thickness was normal. Systolic function was normal.  ------------------------------------------------------------------- Pulmonic  valve:    Structurally normal valve.    Doppler:  There was mild regurgitation.  ------------------------------------------------------------------- Tricuspid valve:   Structurally normal valve.   Leaflet separation was normal.  Doppler:  There was mild regurgitation.  ------------------------------------------------------------------- Pulmonary artery:   The main pulmonary artery was normal-sized.  ------------------------------------------------------------------- Right atrium:  The atrium was normal in size.  No evidence of thrombus in the atrial cavity or appendage. The appendage was morphologically a right appendage.  ------------------------------------------------------------------- Pericardium:  There was no pericardial effusion  Median sternotomy, extracorporeal circulation, aortic valve replacement using an Edwards Inspiris Resilia pericardial aortic valve (model #[id number], serial number [id number]) by Dr. Roxan Hockey on 02/16/2018.  History of Presenting Illness: Jon Ferguson is a 77 year old gentleman with known rheumatic heart disease and known aortic stenosis.  He had a catheterization in March 2017 at [facility] which showed mild coronary disease and severe aortic stenosis.  About 3 or 4 months ago he had an episode of profound dizziness with exertion.  He does not think he lost consciousness at that time.  Yesterday, he was exerting himself picking up a dog that had been hit by a car.  He felt lightheaded and went to his room to sit down.  His wife noted that he lost consciousness for approximately 10 to 15 seconds.  EMS was called and took him to the hospital.  Echocardiogram showed severe aortic stenosis with a peak gradient of 80 mmHg.  This morning a cardiac catheterization which showed a 50% stenosis in a diagonal branch but no other significant coronary disease.  He has not seen a dentist in several years.  He has upper dentures and a few teeth remaining lower.  He  denies any chest pain, shortness of breath, orthopnea, paroxysmal nocturnal dyspnea, or peripheral edema. He does have poor dentition and has not seen a dentist in many years.  An orthopantogram was obtained and Dr. Lawana Chambers was consulted. Ultimately, patient underwent multiple extractions (teeth 27 and 28 with alveoloplasty) on 02/14/2018. Dr. Roxan Hockey then discussed the need for aortic valve replacement. Potential risks, benefits, and complications of the surgery were discussed with the patient and he agreed to proceed with surgery. Pre operative carotid duplex US showed no significant internal bilateral carotid artery stenosis. Patient underwent an aortic valve replacement on 02/16/2018.   Brief Hospital Course:  The patient was extubated the evening of surgery without difficulty. He remained afebrile and hemodynamically stable. Jon Ferguson, a line, chest tubes, and foley were removed early in the post operative course. Lopressor was started and titrated accordingly. He was volume over loaded and diuresed. He had ABL anemia. He did not require a post op transfusion. Last H and H was 10.3 and 31.9. He was weaned off the insulin drip.   He did have a fib post op. He was put on Amiodarone. He then converted and remained in sinus rhythm. The patient's glucose remained well controlled.The patient's HGA1C pre op was 5.  The patient was felt surgically stable for transfer from the ICU to PCTU for further  convalescence but a bed was not available until 10/15.  He continues to progress with cardiac rehab. He was ambulating on room air. He has been tolerating a diet and has had a bowel movement. Epicardial pacing wires were removed on 02/21/2018. Chest tube sutures will be removed the day of discharge. The patient is felt surgically stable for discharge today.   Latest Vital Signs: Blood pressure 127/75, pulse 65, temperature 97.9 F (36.6 C), temperature source Oral, resp. rate (!) 26, height 5\' 10"  (1.778 m),  weight 79.7 kg, SpO2 98 %.  Physical Exam: Cardiovascular: RRR Pulmonary: Mostly clear bilaterally Abdomen: Soft, non tender, bowel sounds present. Extremities: No lower extremity edema. Wounds: Clean and dry.  No erythema or signs of infection.  Discharge Condition: Stable and discharged to home.  Recent laboratory studies:  Lab Results  Component Value Date   WBC 10.5 02/19/2018   HGB 10.3 (L) 02/19/2018   HCT 31.9 (L) 02/19/2018   MCV 96.7 02/19/2018   PLT 189 02/19/2018   Lab Results  Component Value Date   NA 139 02/21/2018   K 4.4 02/21/2018   CL 109 02/21/2018   CO2 23 02/21/2018   CREATININE 1.30 (H) 02/21/2018   GLUCOSE 103 (H) 02/21/2018      Diagnostic Studies: Dg Orthopantogram  Result Date: 02/12/2018 CLINICAL DATA:  Preop testing. EXAM: ORTHOPANTOGRAM/PANORAMIC COMPARISON:  None. FINDINGS: No fracture or bone lesion. Residual mandibular incisors and lateral incisors. Mild periapical lucency noted adjacent to the right lateral mandibular incisor and suggested adjacent to the root of the left mandibular incisor. Patient is otherwise edentulous. Temporomandibular joints are normally aligned. IMPRESSION: Mild periapical lucency adjacent to the right lateral mandibular incisor and likely adjacent to the left incisor. Cannot exclude a root abscess. Electronically Signed   By: Lajean Manes M.D.   On: 02/12/2018 18:00   Dg Chest 2 View  Result Date: 02/19/2018 CLINICAL DATA:  Status post aortic valve replacement EXAM: CHEST - 2 VIEW COMPARISON:  02/18/2018 FINDINGS: Cardiac shadow is enlarged. Postsurgical changes are again seen. Bibasilar atelectasis and small effusions are noted. The previously seen right base lucency is less well visualized when compared with the prior exam. It is not as well visualized on the lateral projection and possibly could be related to a basilar pneumothorax. IMPRESSION: Bibasilar atelectasis and small effusions. Lucency over the right base is  less well visualized than on previous exams. It is poorly visualized on the lateral projection it could possibly be related to some basilar pneumothorax. Continued follow-up is recommended. Electronically Signed   By: Inez Catalina M.D.   On: 02/19/2018 07:18    Discharge Medications: Allergies as of 02/22/2018   No Known Allergies     Medication List    TAKE these medications   acetaminophen 325 MG tablet Commonly known as:  TYLENOL Take 2 tablets (650 mg total) by mouth every 6 (six) hours as needed for mild pain.   amiodarone 200 MG tablet Commonly known as:  PACERONE Take 1 tablet (200 mg total) by mouth 2 (two) times daily. For one week;then take 200 mg daily thereafter   aspirin 325 MG EC tablet Take 1 tablet (325 mg total) by mouth daily.   lisinopril 5 MG tablet Commonly known as:  PRINIVIL,ZESTRIL Take 1 tablet (5 mg total) by mouth daily.   metoprolol tartrate 25 MG tablet Commonly known as:  LOPRESSOR Take 1 tablet (25 mg total) by mouth 2 (two) times daily.   multivitamin with minerals Tabs  tablet Take 1 tablet by mouth daily.   traMADol 50 MG tablet Commonly known as:  ULTRAM Take 50 mg by mouth every 4-6 hours PRN severe pain   vitamin B-12 1000 MCG tablet Commonly known as:  CYANOCOBALAMIN Take 1,000 mcg by mouth daily.      The patient has been discharged on:   1.Beta Blocker:  Yes [ x  ]                              No   [   ]                              If No, reason:  2.Ace Inhibitor/ARB: Yes [ x  ]                                     No  [    ]                                     If No, reason:  3.Statin:   Yes [   ]                  No  [ x  ]                  If No, reason:No CAD  4.Shela Commons:  Yes  [ x  ]                  No   [   ]                  If No, reason:  Follow Up Appointments: Follow-up Information    Call Lenn Cal, DDS.   Specialty:  Dentistry Why:  Call for follow-up evaluation of healing and suture removal  once discharged from anticipated heart valve surgery. Contact information: [place] [address] [phone]        Dixie Dials, MD. Go on 03/20/2018.   Specialty:  Cardiology Why:  Appointment time is at 3:30 pm  Contact information: [place] [address] [phone]        [name], MD. Go on 03/27/2018.   Specialty:  Cardiothoracic Surgery Why:  PA/LAT CXR to be taken (at [facility] which is in the same building as Dr. Leonarda Salon office) on 03/27/2018 at 10:15 am;Appointment time is at 10:45 am Contact information: [place] [address] [phone]           Signed: Sharalyn Ink [facility] 02/22/2018, 8:07 AM

## 2018-02-20 NOTE — Plan of Care (Signed)
°  Problem: Education: °Goal: Ability to demonstrate management of disease process will improve °Outcome: Progressing °Goal: Ability to verbalize understanding of medication therapies will improve °Outcome: Progressing °Goal: Individualized Educational Video(s) °Outcome: Progressing °  °

## 2018-02-20 NOTE — Progress Notes (Signed)
02/20/2018 4:17 PM Pt transferred to 4E-03 via wheelchair with staff from [facility]. Pt given CHG bath. Pt placed in chair. Pt oriented to room and call bell. Call bell within reach. TELE applied, CCMD notified.  Amanda Cockayne, RN

## 2018-02-20 NOTE — Discharge Instructions (Signed)
MOUTH CARE AFTER SURGERY ° °FACTS: °· Ice used in ice bag helps keep the swelling down, and can help lessen the pain. °· It is easier to treat pain BEFORE it happens. °· Spitting disturbs the clot and may cause bleeding to start again, or to get worse. °· Smoking delays healing and can cause complications. °· Sharing prescriptions can be dangerous.  Do not take medications not recently prescribed for you. °· Antibiotics may stop birth control pills from working.  Use other means of birth control while on antibiotics. °· Warm salt water rinses after the first 24 hours will help lessen the swelling:  Use 1/2 teaspoonful of table salt per oz.of water. ° °DO NOT: °· Do not spit.  Do not drink through a straw. °· Strongly advised not to smoke, dip snuff or chew tobacco at least for 3 days. °· Do not eat sharp or crunchy foods.  Avoid the area of surgery when chewing. °· Do not stop your antibiotics before your instructions say to do so. °· Do not eat hot foods until bleeding has stopped.  If you need to, let your food cool down to room temperature. ° °EXPECT: °· Some swelling, especially first 2-3 days. °· Soreness or discomfort in varying degrees.  Follow your dentist's instructions about how to handle pain before it starts. °· Pinkish saliva or light blood in saliva, or on your pillow in the morning.  This can last around 24 hours. °· Bruising inside or outside the mouth.  This may not show up until 2-3 days after surgery.  Don't worry, it will go away in time. °· Pieces of "bone" may work themselves loose.  It's OK.  If they bother you, let us know. ° °WHAT TO DO IMMEDIATELY AFTER SURGERY: °· Bite on the gauze with steady pressure for 1-2 hours.  Don't chew on the gauze. °· Do not lie down flat.  Raise your head support especially for the first 24 hours. °· Apply ice to your face on the side of the surgery.  You may apply it 20 minutes on and a few minutes off.  Ice for 8-12 hours.  You may use ice up to 24  hours. °· Before the numbness wears off, take a pain pill as instructed. °· Prescription pain medication is not always required. ° °SWELLING: °· Expect swelling for the first couple of days.  It should get better after that. °· If swelling increases 3 days or so after surgery; let us know as soon as possible. ° °FEVER: °· Take Tylenol every 4 hours if needed to lower your temperature, especially if it is at 100F or higher. °· Drink lots of fluids. °· If the fever does not go away, let us know. ° °BREATHING TROUBLE: °· Any unusual difficulty breathing means you have to have someone bring you to the emergency room ASAP ° °BLEEDING: °· Light oozing is expected for 24 hours or so. °· Prop head up with pillows °· Avoid spitting °· Do not confuse bright red fresh flowing blood with lots of saliva colored with a little bit of blood. °· If you notice some bleeding, place gauze or a tea bag where it is bleeding and apply CONSTANT pressure by biting down for 1 hour.  Avoid talking during this time.  Do not remove the gauze or tea bag during this hour to "check" the bleeding. °· If you notice bright RED bleeding FLOWING out of particular area, and filling the floor of your mouth, put   a wad of gauze on that area, bite down firmly and constantly.  Call us immediately.  If we're closed, have someone bring you to the emergency room.  ORAL HYGIENE:  Brush your teeth as usual after meals and before bedtime.  Use a soft toothbrush around the area of surgery.  DO NOT AVOID BRUSHING.  Otherwise bacteria(germs) will grow and may delay healing or encourage infection.  Since you cannot spit, just gently rinse and let the water flow out of your mouth.  DO NOT SWISH HARD.  EATING:  Cool liquids are a good point to start.  Increase to soft foods as tolerated.  PRESCRIPTIONS:  Follow the directions for your prescriptions exactly as written.  If Dr. Enrique Sack gave you a narcotic pain medication, do not drive, operate  machinery or drink alcohol when on that medication.  QUESTIONS:  Call our office during office hours [phone] or call the Emergency Room at [phone].   Aortic Valve Replacement, Care After Refer to this sheet in the next few weeks. These instructions provide you with information about caring for yourself after your procedure. Your health care provider may also give you more specific instructions. Your treatment has been planned according to current medical practices, but problems sometimes occur. Call your health care provider if you have any problems or questions after your procedure. What can I expect after the procedure? After the procedure, it is common to have:  Pain around your incision area.  A small amount of blood or clear fluid coming from your incision.  Follow these instructions at home: Eating and drinking   Follow instructions from your health care provider about eating or drinking restrictions. ? Limit alcohol intake to no more than 1 drink per day for nonpregnant women and 2 drinks per day for men. One drink equals 12 oz of beer, 5 oz of wine, or 1 oz of hard liquor. ? Limit how much caffeine you drink. Caffeine can affect your heart's rate and rhythm.  Drink enough fluid to keep your urine clear or pale yellow.  Eat a heart-healthy diet. This should include plenty of fresh fruits and vegetables. If you eat meat, it should be lean cuts. Avoid foods that are: ? High in salt, saturated fat, or sugar. ? Canned or highly processed. ? Fried. Activity  Return to your normal activities as told by your health care provider. Ask your health care provider what activities are safe for you.  Exercise regularly once you have recovered, as told by your health care provider.  Avoid sitting for more than 2 hours at a time without moving. Get up and move around at least once every 1-2 hours. This helps to prevent blood clots in the legs.  Do not lift anything that is  heavier than 10 lb (4.5 kg) until your health care provider approves.  Avoid pushing or pulling things with your arms until your health care provider approves. This includes pulling on handrails to help you climb stairs. Incision care   Follow instructions from your health care provider about how to take care of your incision. Make sure you: ? Wash your hands with soap and water before you change your bandage (dressing). If soap and water are not available, use hand sanitizer. ? Change your dressing as told by your health care provider. ? Leave stitches (sutures), skin glue, or adhesive strips in place. These skin closures may need to stay in place for 2 weeks or longer. If adhesive strip edges start to  loosen and curl up, you may trim the loose edges. Do not remove adhesive strips completely unless your health care provider tells you to do that.  Check your incision area every day for signs of infection. Check for: ? More redness, swelling, or pain. ? More fluid or blood. ? Warmth. ? Pus or a bad smell. Medicines  Take over-the-counter and prescription medicines only as told by your health care provider.  If you were prescribed an antibiotic medicine, take it as told by your health care provider. Do not stop taking the antibiotic even if you start to feel better. Travel  Avoid airplane travel for as long as told by your health care provider.  When you travel, bring a list of your medicines and a record of your medical history with you. Carry your medicines with you. Driving  Ask your health care provider when it is safe for you to drive. Do not drive until your health care provider approves.  Do not drive or operate heavy machinery while taking prescription pain medicine. Lifestyle   Do not use any tobacco products, such as cigarettes, chewing tobacco, or e-cigarettes. If you need help quitting, ask your health care provider.  Resume sexual activity as told by your health care  provider. Do not use medicines for erectile dysfunction unless your health care provider approves, if this applies.  Work with your health care provider to keep your blood pressure and cholesterol under control, and to manage any other heart conditions that you have.  Maintain a healthy weight. General instructions  Do not take baths, swim, or use a hot tub until your health care provider approves.  Do not strain to have a bowel movement.  Avoid crossing your legs while sitting down.  Check your temperature every day for a fever. A fever may be a sign of infection.  If you are a woman and you plan to become pregnant, talk with your health care provider before you become pregnant.  Wear compression stockings if your health care provider instructs you to do this. These stockings help to prevent blood clots and reduce swelling in your legs.  Tell all health care providers who care for you that you have an artificial (prosthetic) aortic valve. If you have or have had heart disease or endocarditis, tell all health care providers about these conditions as well.  Keep all follow-up visits as told by your health care provider. This is important. Contact a health care provider if:  You develop a skin rash.  You experience sudden, unexplained changes in your weight.  You have more redness, swelling, or pain around your incision.  You have more fluid or blood coming from your incision.  Your incision feels warm to the touch.  You have pus or a bad smell coming from your incision.  You have a fever. Get help right away if:  You develop chest pain that is different from the pain coming from your incision.  You develop shortness of breath or difficulty breathing.  You start to feel light-headed. These symptoms may represent a serious problem that is an emergency. Do not wait to see if the symptoms will go away. Get medical help right away. Call your local emergency services (911 in the  [place] Do not drive yourself to the hospital. This information is not intended to replace advice given to you by your health care provider. Make sure you discuss any questions you have with your health care provider. Document Released: 11/11/2004 Document Revised:  10/01/2015 Document Reviewed: 03/29/2015 Elsevier Interactive Patient Education  2017 [employer].

## 2018-02-21 LAB — BASIC METABOLIC PANEL
ANION GAP: 7 (ref 5–15)
BUN: 26 mg/dL — ABNORMAL HIGH (ref 8–23)
CALCIUM: 8.5 mg/dL — AB (ref 8.9–10.3)
CO2: 23 mmol/L (ref 22–32)
Chloride: 109 mmol/L (ref 98–111)
Creatinine, Ser: 1.3 mg/dL — ABNORMAL HIGH (ref 0.61–1.24)
GFR calc Af Amer: >60 mL/min (ref >60)
GFR calc non Af Amer: 52 mL/min — ABNORMAL LOW (ref >60)
GLUCOSE: 103 mg/dL — AB (ref 70–99)
Potassium: 4.4 mmol/L (ref 3.5–5.1)
Sodium: 139 mmol/L (ref 135–145)

## 2018-02-21 MED ORDER — ACETAMINOPHEN 325 MG PO TABS: 650.0000 mg | ORAL_TABLET | Freq: Four times a day (QID) | ORAL | Status: AC | PRN

## 2018-02-21 MED ORDER — METOPROLOL TARTRATE 25 MG PO TABS: 25.0000 mg | ORAL_TABLET | Freq: Two times a day (BID) | ORAL | 1 refills | Status: DC

## 2018-02-21 MED ORDER — ASPIRIN 325 MG PO TBEC: 325.0000 mg | DELAYED_RELEASE_TABLET | Freq: Every day | ORAL | 0 refills | Status: DC

## 2018-02-21 MED ORDER — AMIODARONE HCL 200 MG PO TABS: 200.0000 mg | ORAL_TABLET | Freq: Two times a day (BID) | ORAL | 1 refills | Status: DC

## 2018-02-21 MED ORDER — ADULT MULTIVITAMIN W/MINERALS CH: 1.0000 | ORAL_TABLET | Freq: Every day | ORAL | Status: AC

## 2018-02-21 MED ORDER — FUROSEMIDE 40 MG PO TABS
40.0000 mg | ORAL_TABLET | Freq: Every day | ORAL | Status: DC
  Administered 2018-02-21 – 2018-02-22 (×2): 40 mg via ORAL
  Filled 2018-02-21 (×2): qty 1

## 2018-02-21 NOTE — Progress Notes (Signed)
Ref: Patient, No Pcp Per   Subjective:  Feeling better. Hoping to go home in AM. Heart rate in 60's and sinus rhythm.  Objective:  Vital Signs in the last 24 hours: Temp:  [97.6 F (36.4 C)-98.7 F (37.1 C)] 98.3 F (36.8 C) (10/16 1159) Pulse Rate:  [64-76] 64 (10/16 1159) Cardiac Rhythm: Normal sinus rhythm;Heart block (10/16 0908) Resp:  [18-22] 20 (10/16 1159) BP: (116-144)/(69-88) 116/71 (10/16 1159) SpO2:  [97 %-100 %] 97 % (10/16 1159) Weight:  [80.2 kg-81.3 kg] 81.3 kg (10/16 0548)  Physical Exam: BP Readings from Last 1 Encounters:  02/21/18 116/71     Wt Readings from Last 1 Encounters:  02/21/18 81.3 kg    Weight change: -3.449 kg Body mass index is 25.71 kg/m. HEENT: [place]/AT, Eyes-Blue, PERL, EOMI, Conjunctiva-Pink, Sclera-Non-icteric Neck: No JVD, No bruit, Trachea midline. Lungs:  Clear, Bilateral. Chest wall scar healing well. Cardiac:  Regular rhythm, normal S1 and S2, no S3. II/VI systolic murmur. Abdomen:  Soft, non-tender. BS present. Extremities:  No edema present. No cyanosis. No clubbing. CNS: AxOx3, Cranial nerves grossly intact, moves all 4 extremities.  Skin: Warm and dry.   Intake/Output from previous day: 10/15 0701 - 10/16 0700 In: 399 [P.O.:280; I.V.:119] Out: 100 [Urine:100]    Lab Results: BMET    Component Value Date/Time   NA 139 02/21/2018 0256   NA 139 02/20/2018 0243   NA 139 02/19/2018 0310   K 4.4 02/21/2018 0256   K 3.7 02/20/2018 0243   K 3.7 02/19/2018 0310   CL 109 02/21/2018 0256   CL 105 02/20/2018 0243   CL 109 02/19/2018 0310   CO2 23 02/21/2018 0256   CO2 24 02/20/2018 0243   CO2 24 02/19/2018 0310   GLUCOSE 103 (H) 02/21/2018 0256   GLUCOSE 102 (H) 02/20/2018 0243   GLUCOSE 124 (H) 02/19/2018 0310   BUN 26 (H) 02/21/2018 0256   BUN 29 (H) 02/20/2018 0243   BUN 26 (H) 02/19/2018 0310   CREATININE 1.30 (H) 02/21/2018 0256   CREATININE 1.32 (H) 02/20/2018 0243   CREATININE 1.18 02/19/2018 0310   CALCIUM  8.5 (L) 02/21/2018 0256   CALCIUM 8.3 (L) 02/20/2018 0243   CALCIUM 7.9 (L) 02/19/2018 0310   GFRNONAA 52 (L) 02/21/2018 0256   GFRNONAA 51 (L) 02/20/2018 0243   GFRNONAA 59 (L) 02/19/2018 0310   GFRAA >60 02/21/2018 0256   GFRAA 59 (L) 02/20/2018 0243   GFRAA >60 02/19/2018 0310   CBC    Component Value Date/Time   WBC 10.5 02/19/2018 0310   RBC 3.30 (L) 02/19/2018 0310   HGB 10.3 (L) 02/19/2018 0310   HCT 31.9 (L) 02/19/2018 0310   PLT 189 02/19/2018 0310   MCV 96.7 02/19/2018 0310   MCH 31.2 02/19/2018 0310   MCHC 32.3 02/19/2018 0310   RDW 13.2 02/19/2018 0310   LYMPHSABS 0.6 (L) 02/16/2018 1739   MONOABS 0.6 02/16/2018 1739   EOSABS 0.0 02/16/2018 1739   BASOSABS 0.0 02/16/2018 1739   HEPATIC Function Panel Recent Labs    02/16/18 0423  PROT 5.7*   HEMOGLOBIN A1C No components found for: HGA1C,  MPG CARDIAC ENZYMES No results found for: CKTOTAL, CKMB, CKMBINDEX, TROPONINI BNP No results for input(s): PROBNP in the last 8760 hours. TSH No results for input(s): TSH in the last 8760 hours. CHOLESTEROL No results for input(s): CHOL in the last 8760 hours.  Scheduled Meds: . amiodarone  400 mg Oral Q12H   Followed by  . [  START ON 02/27/2018] amiodarone  200 mg Oral Daily  . aspirin EC  325 mg Oral Daily  . docusate sodium  200 mg Oral Daily  . enoxaparin (LOVENOX) injection  40 mg Subcutaneous QHS  . feeding supplement (GLUCERNA SHAKE)  237 mL Oral TID BM  . furosemide  40 mg Oral Daily  . lisinopril  5 mg Oral Daily  . metoprolol tartrate  25 mg Oral BID   Or  . metoprolol tartrate  25 mg Per Tube BID  . multivitamin with minerals  1 tablet Oral Daily  . sodium chloride flush  3 mL Intravenous Q12H   Continuous Infusions: . 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 Home soon. F/U in 2 weeks.   LOS: 10 days    Dixie Dials  MD  02/21/2018, 12:33 PM

## 2018-02-21 NOTE — Progress Notes (Addendum)
      [address]       [place] [address]             [phone]        5 Days Post-Op Procedure(s) (LRB): AORTIC VALVE REPLACEMENT (AVR). 57mm EDWARDS LIFESCIENCE INSPIRIS VALVE. (N/A) TRANSESOPHAGEAL ECHOCARDIOGRAM (TEE) (N/A)  Subjective: Patient without complaints this am. He states he is going home in am.  Objective: Vital signs in last 24 hours: Temp:  [97.6 F (36.4 C)-98.7 F (37.1 C)] 98.2 F (36.8 C) (10/16 0245) Pulse Rate:  [67-76] 73 (10/16 0245) Cardiac Rhythm: Normal sinus rhythm;Heart block (10/15 2217) Resp:  [17-23] 22 (10/16 0245) BP: (116-144)/(69-88) 144/88 (10/16 0245) SpO2:  [97 %-100 %] 97 % (10/16 0245) Weight:  [80.2 kg-81.3 kg] 81.3 kg (10/16 0548)  Pre op weight 78.9 kg Current Weight  02/21/18 81.3 kg      Intake/Output from previous day: 10/15 0701 - 10/16 0700 In: 399 [P.O.:280; I.V.:119] Out: 100 [Urine:100]   Physical Exam:  Cardiovascular: RRR Pulmonary: Slightly diminished at bases Abdomen: Soft, non tender, bowel sounds present. Extremities: Mild bilateral lower extremity edema. Wounds: Clean and dry.  No erythema or signs of infection.  Lab Results: CBC: Recent Labs    02/19/18 0310  WBC 10.5  HGB 10.3*  HCT 31.9*  PLT 189   BMET:  Recent Labs    02/20/18 0243 02/21/18 0256  NA 139 139  K 3.7 4.4  CL 105 109  CO2 24 23  GLUCOSE 102* 103*  BUN 29* 26*  CREATININE 1.32* 1.30*  CALCIUM 8.3* 8.5*    PT/INR:  Lab Results  Component Value Date   INR 1.48 02/16/2018   INR 1.06 02/12/2018   ABG:  INR: Will add last result for INR, ABG once components are confirmed Will add last 4 CBG results once components are confirmed  Assessment/Plan:  1. CV - Previous a fib. First degree heart block, SR. On Amiodarone 400 mg bid, Lopressor 25 mg bid, Lisinopril 5 mg daily. May need to increase Lisinopril for better BP control. 2.  Pulmonary - On room air. Encourage incentive spirometer 3.  Volume Overload - Will give Lasix 4.  Acute blood loss anemia - Last H and H 10.3 and 31.9 5. Creatinine remains at 1.3 6. Hope to discharge in am  Sharalyn Ink [facility] 02/21/2018,7:04 AM [phone]  Patient seen and examined, agree with above Making good progress Home in AM if no rhythm issues  Remo Lipps C. Roxan Hockey, MD [facility] [phone]

## 2018-02-21 NOTE — Progress Notes (Signed)
Patient ambulated 470 feet in the hallway with a front wheel walker. Patient was able to carry a conversation without being short of breath. Response was tolerated well. Patient back in bed resting. Will continue to monitor. Lajoyce Corners, RN

## 2018-02-21 NOTE — Progress Notes (Signed)
EPW's removed per MD order without difficulty.  Will continue to monitor. 

## 2018-02-21 NOTE — Care Management Important Message (Signed)
Important Message  Patient Details  Name: Jon Ferguson MRN: [id number] Date of Birth: 12-11-1940   Medicare Important Message Given:  Yes Patient asleep, unsigned copy left at bedside   [name] 02/21/2018, 12:48 PM

## 2018-02-21 NOTE — Progress Notes (Signed)
CARDIAC REHAB PHASE I   PRE:  Rate/Rhythm: 65 SR    BP: sitting 110/52    SaO2: 97 RA  MODE:  Ambulation: 750 ft   POST:  Rate/Rhythm: 82 SR    BP: sitting 121/71     SaO2: 97 RA  Pt moving very well. Able to get up independently but did need reminders for sternal precautions. Steady in hall, no c/o. Sts he feels well. To recliner, VSS. He can walk independently if he can get on telemetry box. Will f/u am for ed. [place], ACSM 02/21/2018 1:21 PM

## 2018-02-22 MED ORDER — LISINOPRIL 5 MG PO TABS: 5.0000 mg | ORAL_TABLET | Freq: Every day | ORAL | 1 refills | Status: DC

## 2018-02-22 MED ORDER — AMIODARONE HCL 200 MG PO TABS
200.0000 mg | ORAL_TABLET | Freq: Two times a day (BID) | ORAL | Status: DC
  Administered 2018-02-22: 200 mg via ORAL
  Filled 2018-02-22: qty 1

## 2018-02-22 MED ORDER — TRAMADOL HCL 50 MG PO TABS: ORAL_TABLET | ORAL | 0 refills | Status: DC

## 2018-02-22 NOTE — Progress Notes (Addendum)
      [address]       [place] [address]             [phone]        6 Days Post-Op Procedure(s) (LRB): AORTIC VALVE REPLACEMENT (AVR). 61mm EDWARDS LIFESCIENCE INSPIRIS VALVE. (N/A) TRANSESOPHAGEAL ECHOCARDIOGRAM (TEE) (N/A)  Subjective: Patient without complaints this am. He states he is going home in am.  Objective: Vital signs in last 24 hours: Temp:  [97.9 F (36.6 C)-98.3 F (36.8 C)] 97.9 F (36.6 C) (10/17 0431) Pulse Rate:  [64-65] 65 (10/17 0431) Cardiac Rhythm: Normal sinus rhythm;Heart block (10/16 1900) Resp:  [18-26] 26 (10/17 0431) BP: (116-127)/(71-75) 127/75 (10/17 0431) SpO2:  [97 %-100 %] 98 % (10/17 0431) Weight:  [79.7 kg] 79.7 kg (10/17 0431)  Pre op weight 78.9 kg Current Weight  02/22/18 79.7 kg      Intake/Output from previous day: No intake/output data recorded.   Physical Exam:  Cardiovascular: RRR Pulmonary: Mostly clear bilaterally Abdomen: Soft, non tender, bowel sounds present. Extremities: No lower extremity edema. Wounds: Clean and dry.  No erythema or signs of infection.  Lab Results: CBC: No results for input(s): WBC, HGB, HCT, PLT in the last 72 hours. BMET:  Recent Labs    02/20/18 0243 02/21/18 0256  NA 139 139  K 3.7 4.4  CL 105 109  CO2 24 23  GLUCOSE 102* 103*  BUN 29* 26*  CREATININE 1.32* 1.30*  CALCIUM 8.3* 8.5*    PT/INR:  Lab Results  Component Value Date   INR 1.48 02/16/2018   INR 1.06 02/12/2018   ABG:  INR: Will add last result for INR, ABG once components are confirmed Will add last 4 CBG results once components are confirmed  Assessment/Plan:  1. CV - Previous a fib. First degree heart block, has been maintaining SR. On Amiodarone 400 mg bid, Lopressor 25 mg bid, Lisinopril 5 mg daily. May need to increase Lisinopril for better BP control. 2.  Pulmonary - On room air. Encourage incentive spirometer 3. Volume Overload - Lasix 40 mg daily.  4.  Acute blood loss  anemia - Last H and H 10.3 and 31.9 5. Discharge  Sharalyn Ink ZimmermanPA-C 02/22/2018,7:07 AM [phone] Patient seen and examined, agree with above Home today  Revonda Standard. Roxan Hockey, MD [facility] [phone]

## 2018-02-22 NOTE — Progress Notes (Signed)
Ref: Patient, No Pcp Per   Subjective:  Awake. Ambulated well. Afebrile. Moniotr shows sinus rhythm.  Objective:  Vital Signs in the last 24 hours: Temp:  [97.9 F (36.6 C)-98.3 F (36.8 C)] 97.9 F (36.6 C) (10/17 0431) Pulse Rate:  [64-65] 65 (10/17 0431) Cardiac Rhythm: Normal sinus rhythm;Heart block (10/17 0701) Resp:  [18-26] 26 (10/17 0431) BP: (116-127)/(71-75) 127/75 (10/17 0431) SpO2:  [97 %-100 %] 98 % (10/17 0431) Weight:  [79.7 kg] 79.7 kg (10/17 0431)  Physical Exam: BP Readings from Last 1 Encounters:  02/22/18 127/75     Wt Readings from Last 1 Encounters:  02/22/18 79.7 kg    Weight change: -0.45 kg Body mass index is 25.21 kg/m. HEENT: [place]/AT, Eyes-Blue, PERL, EOMI, Conjunctiva-Pink, Sclera-Non-icteric Neck: No JVD, No bruit, Trachea midline. Lungs:  Clear, Bilateral. Healing mid-line chest wall scar. Cardiac:  Regular rhythm, normal S1 and S2, no S3. II/VI systolic murmur. Abdomen:  Soft, non-tender. BS present. Extremities:  No edema present. No cyanosis. No clubbing. CNS: AxOx3, Cranial nerves grossly intact, moves all 4 extremities.  Skin: Warm and dry.   Intake/Output from previous day: No intake/output data recorded.    Lab Results: BMET    Component Value Date/Time   NA 139 02/21/2018 0256   NA 139 02/20/2018 0243   NA 139 02/19/2018 0310   K 4.4 02/21/2018 0256   K 3.7 02/20/2018 0243   K 3.7 02/19/2018 0310   CL 109 02/21/2018 0256   CL 105 02/20/2018 0243   CL 109 02/19/2018 0310   CO2 23 02/21/2018 0256   CO2 24 02/20/2018 0243   CO2 24 02/19/2018 0310   GLUCOSE 103 (H) 02/21/2018 0256   GLUCOSE 102 (H) 02/20/2018 0243   GLUCOSE 124 (H) 02/19/2018 0310   BUN 26 (H) 02/21/2018 0256   BUN 29 (H) 02/20/2018 0243   BUN 26 (H) 02/19/2018 0310   CREATININE 1.30 (H) 02/21/2018 0256   CREATININE 1.32 (H) 02/20/2018 0243   CREATININE 1.18 02/19/2018 0310   CALCIUM 8.5 (L) 02/21/2018 0256   CALCIUM 8.3 (L) 02/20/2018 0243   CALCIUM 7.9 (L) 02/19/2018 0310   GFRNONAA 52 (L) 02/21/2018 0256   GFRNONAA 51 (L) 02/20/2018 0243   GFRNONAA 59 (L) 02/19/2018 0310   GFRAA >60 02/21/2018 0256   GFRAA 59 (L) 02/20/2018 0243   GFRAA >60 02/19/2018 0310   CBC    Component Value Date/Time   WBC 10.5 02/19/2018 0310   RBC 3.30 (L) 02/19/2018 0310   HGB 10.3 (L) 02/19/2018 0310   HCT 31.9 (L) 02/19/2018 0310   PLT 189 02/19/2018 0310   MCV 96.7 02/19/2018 0310   MCH 31.2 02/19/2018 0310   MCHC 32.3 02/19/2018 0310   RDW 13.2 02/19/2018 0310   LYMPHSABS 0.6 (L) 02/16/2018 1739   MONOABS 0.6 02/16/2018 1739   EOSABS 0.0 02/16/2018 1739   BASOSABS 0.0 02/16/2018 1739   HEPATIC Function Panel Recent Labs    02/16/18 0423  PROT 5.7*   HEMOGLOBIN A1C No components found for: HGA1C,  MPG CARDIAC ENZYMES No results found for: CKTOTAL, CKMB, CKMBINDEX, TROPONINI BNP No results for input(s): PROBNP in the last 8760 hours. TSH No results for input(s): TSH in the last 8760 hours. CHOLESTEROL No results for input(s): CHOL in the last 8760 hours.  Scheduled Meds: . amiodarone  200 mg Oral Q12H  . aspirin EC  325 mg Oral Daily  . docusate sodium  200 mg Oral Daily  . enoxaparin (LOVENOX) injection  40 mg Subcutaneous QHS  . feeding supplement (GLUCERNA SHAKE)  237 mL Oral TID BM  . furosemide  40 mg Oral Daily  . lisinopril  5 mg Oral Daily  . metoprolol tartrate  25 mg Oral BID   Or  . metoprolol tartrate  25 mg Per Tube BID  . multivitamin with minerals  1 tablet Oral Daily  . sodium chloride flush  3 mL Intravenous Q12H   Continuous Infusions: . sodium chloride     PRN Meds:.sodium chloride, acetaminophen, alum & mag hydroxide-simeth, bisacodyl **OR** bisacodyl, guaiFENesin, ondansetron **OR** ondansetron (ZOFRAN) IV, oxyCODONE, sodium chloride flush, traMADol  Assessment/Plan: Prosthetic AV S/P Severe AS S/P syncope from above Paroxysmal atrial fib following surgery HTN CKD II  Home today. F/U  in 2-3 weeks.   LOS: 11 days    Dixie Dials  MD  02/22/2018, 8:54 AM

## 2018-02-22 NOTE — Progress Notes (Signed)
02/22/2018 11:39 AM Discharge AVS meds take and those due reviewed with pt. Follow up appointments and when to call MD reviewed. All questions and concerns addressed. No further questions at this time. D/c IV and TELE, CCMD notified. D/C home per orders. Brought down via wheelchair with volunteer.

## 2018-02-22 NOTE — Progress Notes (Signed)
CARDIAC REHAB PHASE I   Ed completed with pt. Good reception. Set up d/c video. Will refer to [place], ACSM 02/22/2018 10:00 AM

## 2018-03-26 ENCOUNTER — Other Ambulatory Visit: Payer: Self-pay | Admitting: Thoracic Surgery (Cardiothoracic Vascular Surgery)

## 2018-03-26 DIAGNOSIS — Z952 Presence of prosthetic heart valve: Secondary | ICD-10-CM

## 2018-03-27 ENCOUNTER — Encounter: Payer: Self-pay | Admitting: Thoracic Surgery (Cardiothoracic Vascular Surgery)

## 2018-03-27 ENCOUNTER — Other Ambulatory Visit: Payer: Self-pay

## 2018-03-27 ENCOUNTER — Ambulatory Visit
Admission: RE | Admit: 2018-03-27 | Discharge: 2018-03-27 | Disposition: A | Discharge status: Home / Self Care | Payer: Medicare Other | Source: Ambulatory Visit | Attending: Thoracic Surgery (Cardiothoracic Vascular Surgery) | Admitting: Thoracic Surgery (Cardiothoracic Vascular Surgery)

## 2018-03-27 ENCOUNTER — Ambulatory Visit (INDEPENDENT_AMBULATORY_CARE_PROVIDER_SITE_OTHER): Payer: Self-pay | Admitting: Thoracic Surgery (Cardiothoracic Vascular Surgery)

## 2018-03-27 VITALS — BP 153/82 | HR 51 | Resp 16 | Ht 70.0 in | Wt 178.4 lb

## 2018-03-27 DIAGNOSIS — Z952 Presence of prosthetic heart valve: Secondary | ICD-10-CM

## 2018-03-27 DIAGNOSIS — I350 Nonrheumatic aortic (valve) stenosis: Secondary | ICD-10-CM

## 2018-03-27 NOTE — Progress Notes (Signed)
[address]       [place] [address]             [phone]     HPI: Jon Ferguson is for scheduled postoperative follow-up visit  Jon Ferguson is a 77 year old gentleman with known rheumatic heart disease and aortic stenosis.  He presented with syncope.  Echocardiogram showed severe aortic stenosis with a peak gradient of 80 mmHg.  Cardiac catheterization showed no significant coronary disease.  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  He had atrial fibrillation postoperatively, but converted to sinus rhythm with amiodarone and Lopressor.  He went home on postoperative day #6.  He feels well.  He is not having any incisional pain.  He has not had any dizziness or syncope.  He has not had any chest pain or shortness of breath.  He is anxious to resume full activities.  Past Medical History:  Diagnosis Date  . Hypertension   . Rheumatic heart disease   Aortic stenosis  Current Outpatient Medications  Medication Sig Dispense Refill  . acetaminophen (TYLENOL) 325 MG tablet Take 2 tablets (650 mg total) by mouth every 6 (six) hours as needed for mild pain.    Marland Kitchen amiodarone (PACERONE) 200 MG tablet Take 1 tablet (200 mg total) by mouth 2 (two) times daily. For one week;then take 200 mg daily thereafter 60 tablet 1  . aspirin EC 325 MG EC tablet Take 1 tablet (325 mg total) by mouth daily. 30 tablet 0  . lisinopril (PRINIVIL,ZESTRIL) 5 MG tablet Take 1 tablet (5 mg total) by mouth daily. 30 tablet 1  . metoprolol tartrate (LOPRESSOR) 25 MG tablet Take 1 tablet (25 mg total) by mouth 2 (two) times daily. 60 tablet 1  . Multiple Vitamin (MULTIVITAMIN WITH MINERALS) TABS tablet Take 1 tablet by mouth daily.    . vitamin B-12 (CYANOCOBALAMIN) 1000 MCG tablet Take 1,000 mcg by mouth daily.     No current facility-administered medications for this visit.     Physical Exam BP (!) 153/82 (BP Location: Right  Arm, Patient Position: Sitting, Cuff Size: Large)   Pulse (!) 51   Resp 16   Ht 5\' 10"  (1.778 m)   Wt 178 lb 6.4 oz (80.9 kg)   SpO2 98% Comment: RA  BMI 25.27 kg/m  77 year old man in no acute distress Alert and oriented x3 with no focal deficits Lungs clear with equal breath sounds bilaterally Cardiac regular rate and rhythm normal S1-S2 Sternum stable, incision well-healed  Diagnostic Tests: CHEST - 2 VIEW  COMPARISON:  02/19/2018  FINDINGS: Previous median sternotomy and aortic valve replacement. Chronic left ventricular prominence. Chronic aortic atherosclerosis with calcification and tortuosity. The lungs are clear. The vascularity is normal. No effusion. 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  IMPRESSION: No active disease. Previous aortic valve replacement. Lungs well aerated. Normal vascularity. No pleural fluid.   Electronically Signed   By: Nelson Chimes M.D.   On: 03/27/2018 10:26 I personally reviewed the chest x-ray and concur with the findings noted  Impression: Jon Ferguson is a 77 year old gentleman with rheumatic aortic stenosis who presented with a syncopal spell.  Work-up revealed severe aortic stenosis with a peak gradient of 80 mmHg.  He underwent aortic valve replacement with a bovine pericardial valve on 03/2018.  Aortic stenosis-status post aortic valve replacement.  Bovine pericardial valve.  He is on aspirin.  Postoperative atrial fibrillation-in sinus  bradycardia today.  He is on Lopressor and amiodarone.  He just refilled his amiodarone.  He will discontinue after he completes his current prescription.  Hypertension-blood pressure elevated.  He says Dr. Doylene Canard was going to prescribe a new blood pressure medication for him.  That has not been done yet.  I advised him to check back with his office.  He may begin driving.  No heavy lifting for another week.  Beyond that his activities are  unrestricted.  Plan: Follow-up with Dr. Doylene Canard  I will be happy to see him back anytime in the future if I can be of any further assistance with his care  [name], MD [facility] [phone]

## 2018-12-19 IMAGING — CR DG CHEST 2V
2 series · 2 of 2 positions shown · non-contrast
Comparison: 02/19/2018

CLINICAL DATA: Followup aortic valve replacement

EXAM:
CHEST - 2 VIEW

[w chest pa]
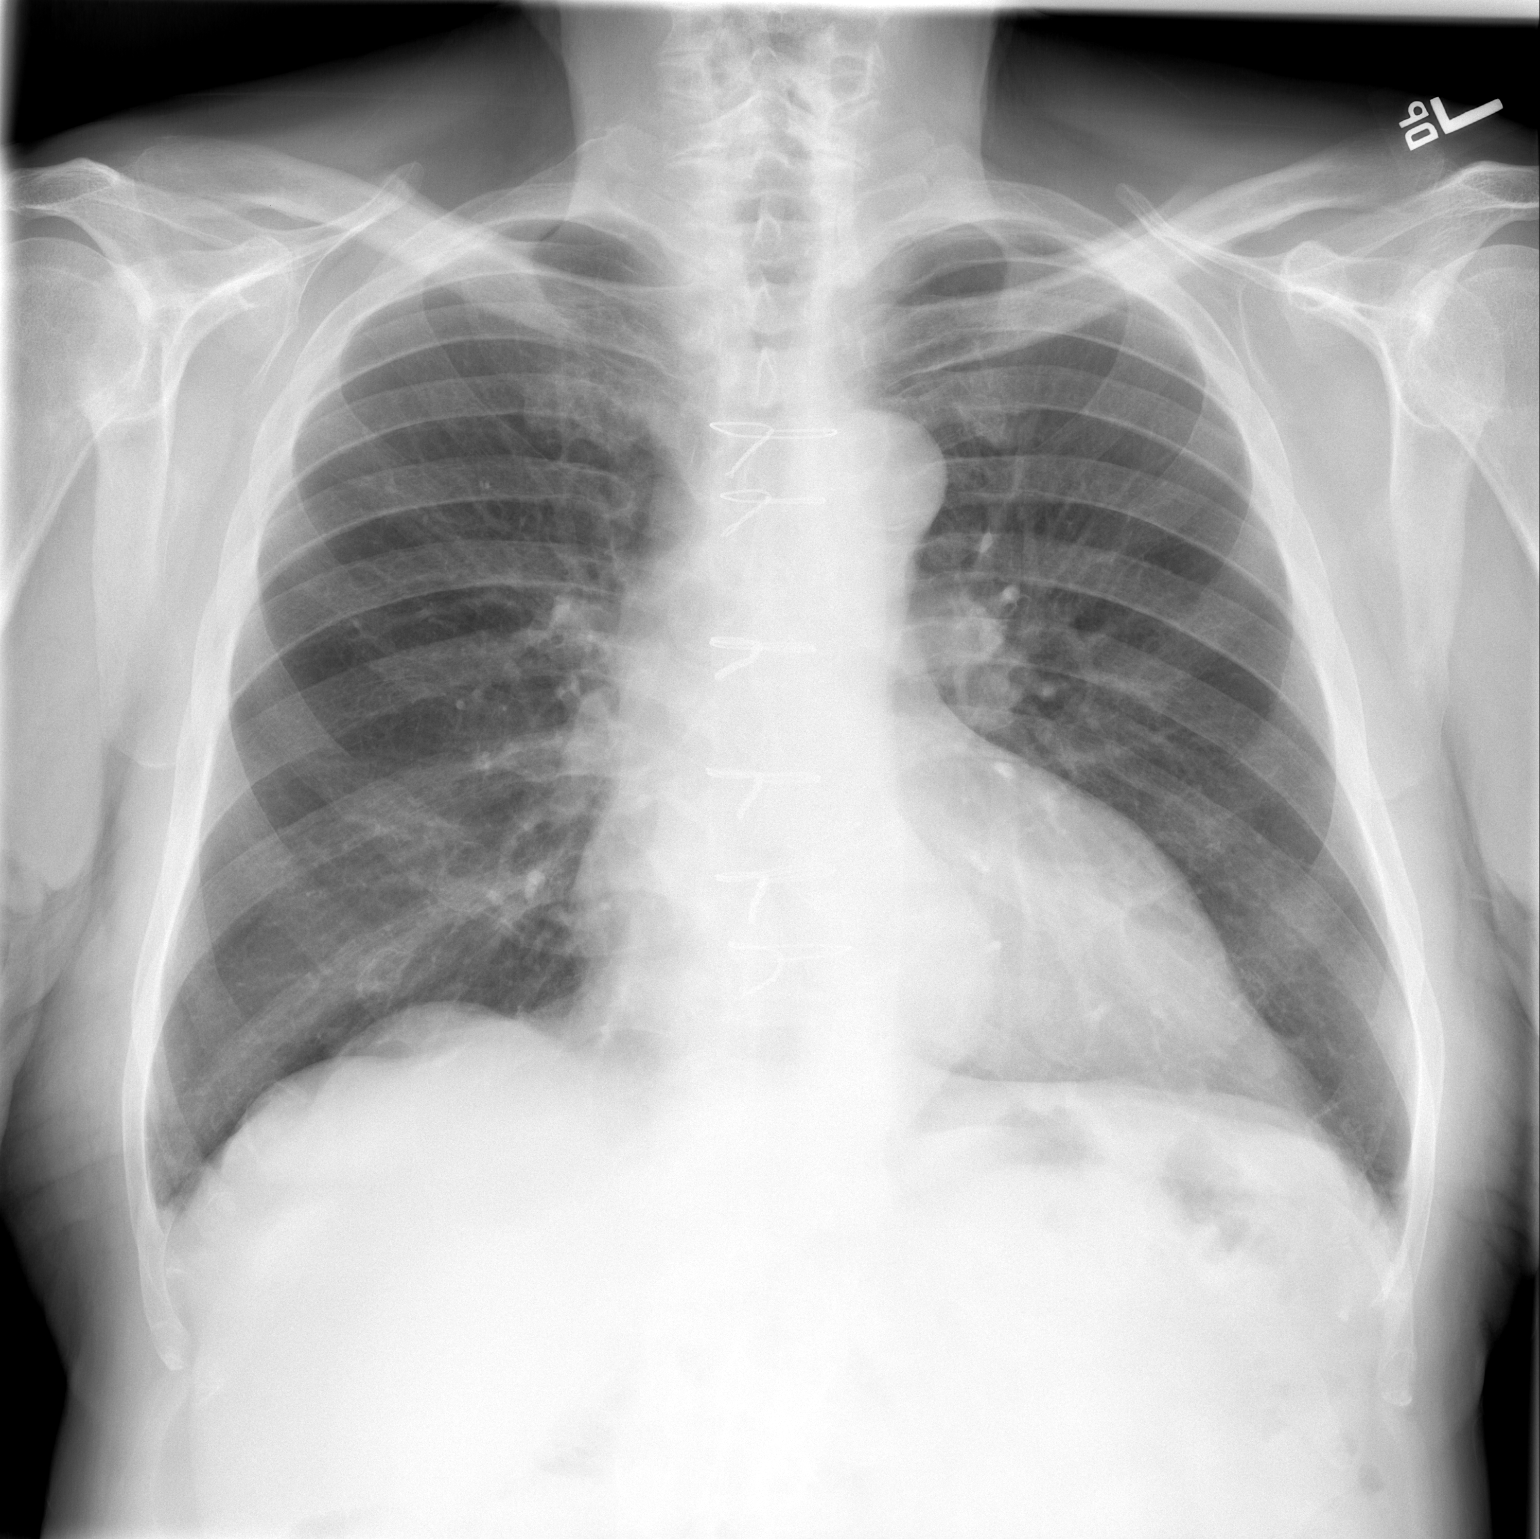

[w chest lat]
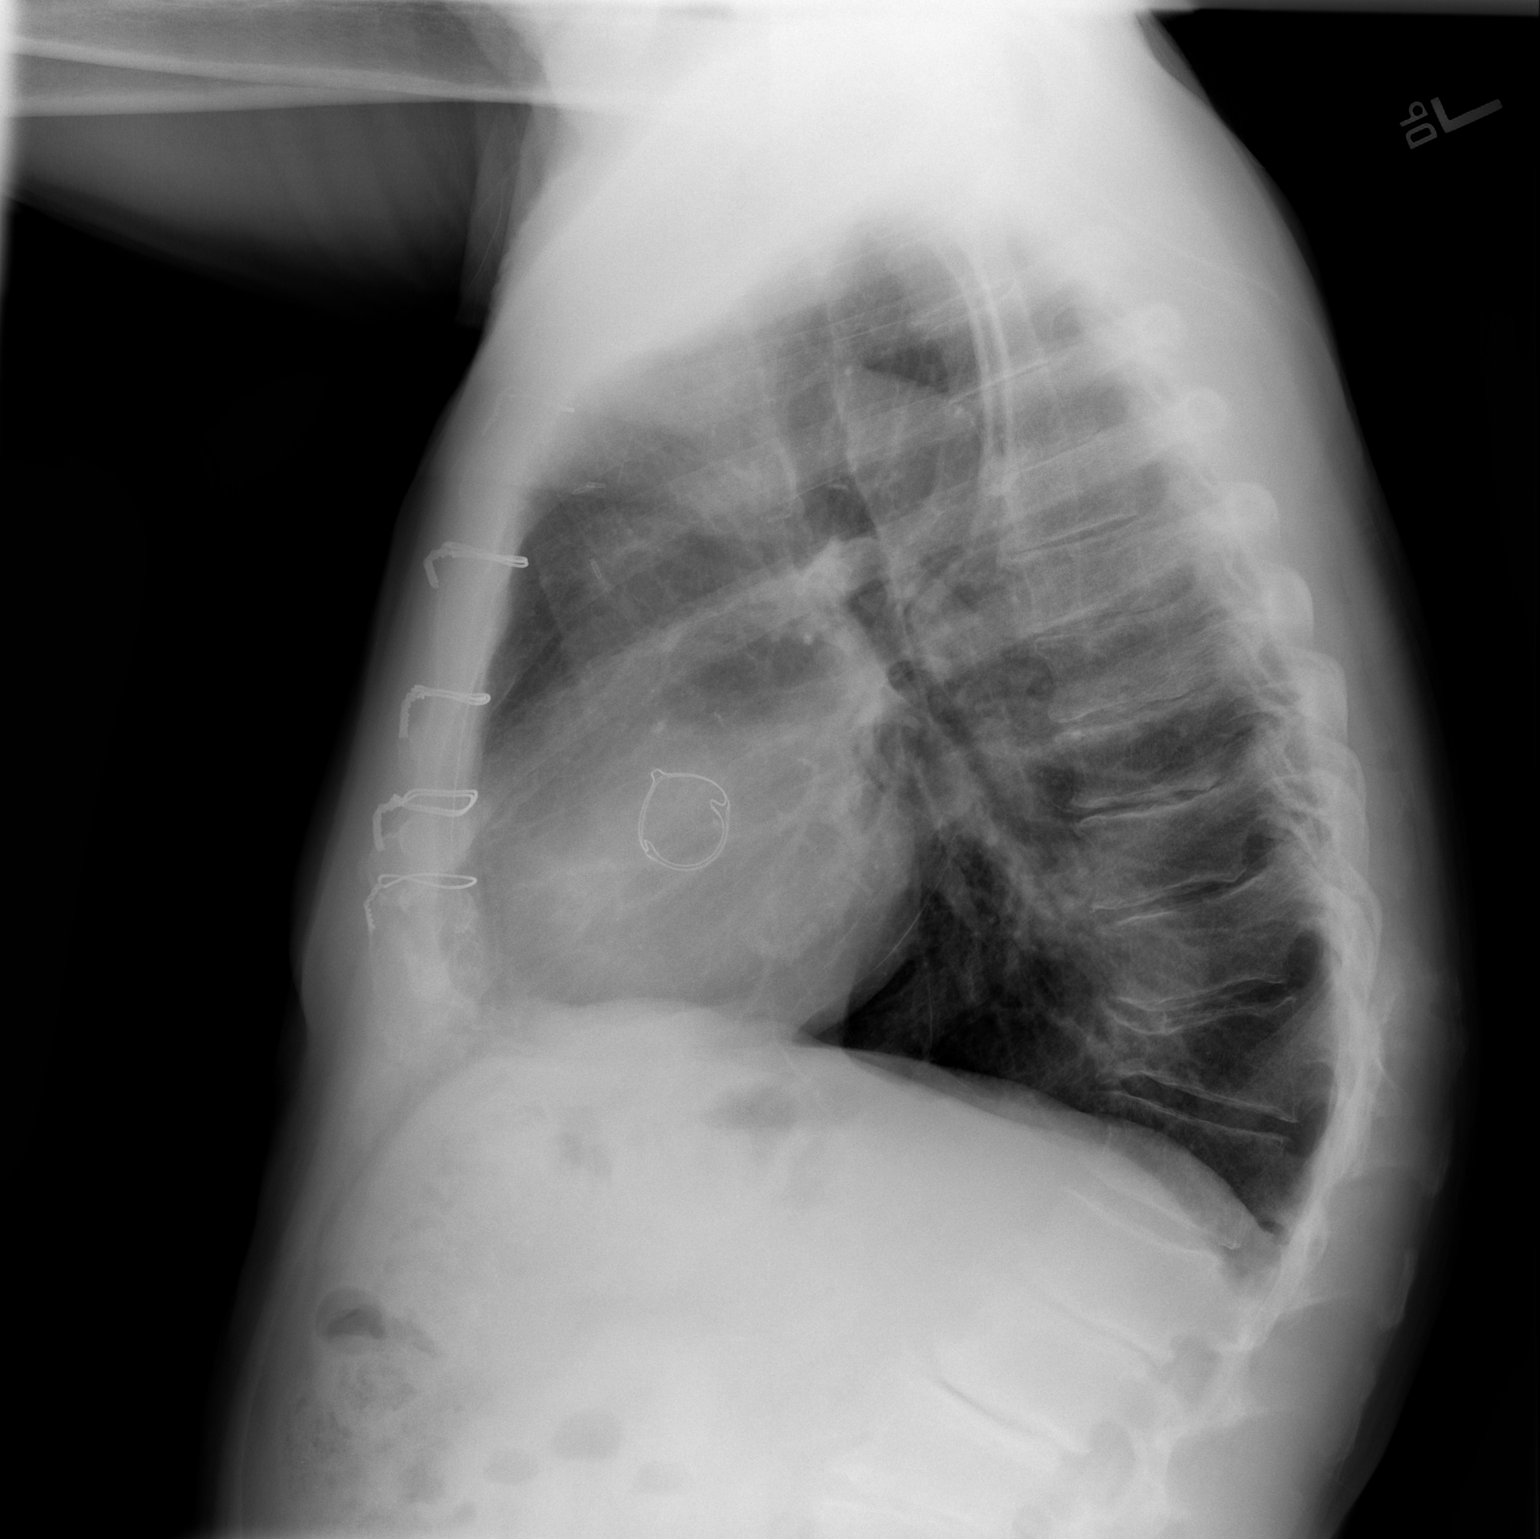

[2 of 2 positions shown; findings below may reference images not displayed]

FINDINGS: Previous median sternotomy and aortic valve replacement. Chronic
left ventricular prominence. Chronic aortic atherosclerosis with
calcification and tortuosity. The lungs are clear. The vascularity
is normal. No effusion. Chronic thoracic compression fractures with
kyphotic curvature.
IMPRESSION: No active disease. Previous aortic valve replacement. Lungs well
aerated. Normal vascularity. No pleural fluid.

## 2021-12-14 DIAGNOSIS — Z01818 Encounter for other preprocedural examination: Secondary | ICD-10-CM | POA: Diagnosis not present

## 2021-12-20 ENCOUNTER — Telehealth: Payer: Self-pay

## 2021-12-20 ENCOUNTER — Other Ambulatory Visit: Payer: Self-pay

## 2021-12-20 DIAGNOSIS — I10 Essential (primary) hypertension: Secondary | ICD-10-CM | POA: Insufficient documentation

## 2021-12-20 DIAGNOSIS — M19012 Primary osteoarthritis, left shoulder: Secondary | ICD-10-CM | POA: Insufficient documentation

## 2021-12-20 DIAGNOSIS — I099 Rheumatic heart disease, unspecified: Secondary | ICD-10-CM | POA: Insufficient documentation

## 2021-12-20 DIAGNOSIS — M19011 Primary osteoarthritis, right shoulder: Secondary | ICD-10-CM | POA: Insufficient documentation

## 2021-12-20 DIAGNOSIS — I350 Nonrheumatic aortic (valve) stenosis: Secondary | ICD-10-CM | POA: Insufficient documentation

## 2021-12-20 DIAGNOSIS — C679 Malignant neoplasm of bladder, unspecified: Secondary | ICD-10-CM | POA: Insufficient documentation

## 2021-12-20 DIAGNOSIS — M7138 Other bursal cyst, other site: Secondary | ICD-10-CM | POA: Insufficient documentation

## 2021-12-20 DIAGNOSIS — M5416 Radiculopathy, lumbar region: Secondary | ICD-10-CM | POA: Insufficient documentation

## 2021-12-20 NOTE — Telephone Encounter (Signed)
Patient has never been seen by our office. He has a New Patient Visit with Dr. Geraldo Pitter scheduled for 12/23/2021 for pre-op evaluation. Will route this clearance form to Dr. Geraldo Pitter so he is aware and remove from pre-op pool.  Darreld Mclean, PA-C 12/20/2021 6:32 PM

## 2021-12-20 NOTE — Telephone Encounter (Signed)
   Pre-operative Risk Assessment    Patient Name: Jon Ferguson  DOB: 1940-09-07 MRN: [id number]      Request for Surgical Clearance    Procedure:   lumbar laminectomy  Date of Surgery:  Clearance TBD                                 Surgeon:  Dr. Creig Hines Surgeon's Group or Practice Name:  [facility] Phone number:  [phone] Fax number:  [phone]   Type of Clearance Requested:   - Medical    Type of Anesthesia:  General    Additional requests/questions:    SignedLowella Grip   12/20/2021, 4:38 PM

## 2021-12-23 ENCOUNTER — Telehealth (HOSPITAL_COMMUNITY): Payer: Self-pay | Admitting: *Deleted

## 2021-12-23 ENCOUNTER — Ambulatory Visit (INDEPENDENT_AMBULATORY_CARE_PROVIDER_SITE_OTHER): Payer: Medicare Other | Admitting: Cardiology

## 2021-12-23 ENCOUNTER — Encounter: Payer: Self-pay | Admitting: Cardiology

## 2021-12-23 VITALS — BP 166/106 | HR 65 | Ht 71.0 in | Wt 177.0 lb

## 2021-12-23 DIAGNOSIS — E782 Mixed hyperlipidemia: Secondary | ICD-10-CM

## 2021-12-23 DIAGNOSIS — I2510 Atherosclerotic heart disease of native coronary artery without angina pectoris: Secondary | ICD-10-CM

## 2021-12-23 DIAGNOSIS — Z952 Presence of prosthetic heart valve: Secondary | ICD-10-CM

## 2021-12-23 DIAGNOSIS — Z01810 Encounter for preprocedural cardiovascular examination: Secondary | ICD-10-CM

## 2021-12-23 HISTORY — DX: Atherosclerotic heart disease of native coronary artery without angina pectoris: I25.10

## 2021-12-23 HISTORY — DX: Encounter for preprocedural cardiovascular examination: Z01.810

## 2021-12-23 HISTORY — DX: Mixed hyperlipidemia: E78.2

## 2021-12-23 NOTE — Telephone Encounter (Signed)
Patient given detailed instructions per Myocardial Perfusion Study Information Sheet for the test on 12/28/2021 at 8:00. Patient notified to arrive 15 minutes early and that it is imperative to arrive on time for appointment to keep from having the test rescheduled.  If you need to cancel or reschedule your appointment, please call the office within 24 hours of your appointment. . Patient verbalized understanding.Jon Ferguson

## 2021-12-23 NOTE — Progress Notes (Signed)
Cardiology Office Note:    Date:  12/23/2021   ID:  Jon Ferguson, DOB [birth year], MRN [id number]  PCP:  Tamsen Roers, MD  Cardiologist:  Jenean Lindau, MD   Referring MD: Creig Hines, MD    ASSESSMENT:    1. S/P AVR   2. Coronary artery disease involving native coronary artery of native heart without angina pectoris   3. Preoperative cardiovascular examination   4. Mixed dyslipidemia    PLAN:    In order of problems listed above:  Preoperative risk stratification from cardiovascular standpoint: I discussed my findings with the patient at extensive length.  He has known coronary artery disease and therefore we will do a Lexiscan sestamibi.  He is agreeable with this.  If this is negative then he is not at high risk for coronary events during the aforementioned surgery.  Meticulous hemodynamic monitoring will further reduce risk of coronary events. Post aortic valve replacement: Echocardiogram will be done to assess the status of this.  Its been close to 4 years since this was done.  He is asymptomatic.  He continues to take a coated baby aspirin on a daily basis. Mixed dyslipidemia: I am not sure why he is not on statins.  I told him about this and he wants to review this and revisited after his current issues with pain and surgery are taken care of.  I respect his wishes.  Benefits risks explained and questions were answered to his and his wife satisfaction. Patient will be seen in follow-up appointment in 6 months or earlier if the patient has any concerns \   Medication Adjustments/Labs and Tests Ordered: Current medicines are reviewed at length with the patient today.  Concerns regarding medicines are outlined above.  No orders of the defined types were placed in this encounter.  No orders of the defined types were placed in this encounter.    History of Present Illness:    Jon Ferguson is a 81 y.o. male who is being seen today for the evaluation of preoperative  cardiovascular evaluation at the request of Creig Hines, MD. patient is a pleasant 81 year old male.  He has past medical history of aortic stenosis post bioprosthetic aortic valve replacement in 2019.  He had mild coronary artery disease at that point.  He is here for preop assessment.  He has significant issues with pain and cyst impinging on his spinal cord.  He is planning to undergo surgery.  For this reason he leads a sedentary lifestyle.  No chest pain orthopnea or PND.  He has history of mixed dyslipidemia.  At the time of my evaluation, the patient is alert awake oriented and in no distress.  Past Medical History:  Diagnosis Date   Acute right lumbar radiculopathy    Aortic stenosis    Aortic valve stenosis 02/11/2018   Bilateral inguinal hernia without obstruction or gangrene 07/13/2015   Bladder cancer ([place])    Essential hypertension 07/08/2015   Hypertension    Osteoarthritis of left glenohumeral joint    Osteoarthritis of right glenohumeral joint    Postoperative examination 09/29/2015   Pre-operative cardiovascular examination 07/08/2015   Rheumatic heart disease    Right inguinal pain 07/13/2015   S/P AVR 02/16/2018   Syncope 02/11/2018   Synovial cyst of lumbar spine    Ventral hernia without obstruction or gangrene 07/13/2015    Past Surgical History:  Procedure Laterality Date   AORTIC VALVE REPLACEMENT N/A 02/16/2018   Procedure: AORTIC VALVE REPLACEMENT (AVR). 40m  EDWARDS LIFESCIENCE INSPIRIS VALVE.;  Surgeon: [name], MD;  Location: [place];  Service: Open Heart Surgery;  Laterality: N/A;   APPENDECTOMY     HERNIA REPAIR     MULTIPLE EXTRACTIONS WITH ALVEOLOPLASTY N/A 02/14/2018   Procedure: Extraction of tooth #'s 27 and 28 with alveoloplasty;  Surgeon: Lenn Cal, DDS;  Location: [place];  Service: Oral Surgery;  Laterality: N/A;   RIGHT/LEFT HEART CATH AND CORONARY ANGIOGRAPHY N/A 02/12/2018   Procedure: RIGHT/LEFT HEART CATH AND CORONARY ANGIOGRAPHY;   Surgeon: Dixie Dials, MD;  Location: [place] [facility];  Service: Cardiovascular;  Laterality: N/A;   TEE WITHOUT CARDIOVERSION N/A 02/13/2018   Procedure: TRANSESOPHAGEAL ECHOCARDIOGRAM (TEE);  Surgeon: Dixie Dials, MD;  [facility];  Service: Cardiovascular;  Laterality: N/A;   TEE WITHOUT CARDIOVERSION N/A 02/16/2018   Procedure: TRANSESOPHAGEAL ECHOCARDIOGRAM (TEE);  Surgeon: [name], MD;  Location: [place];  Service: Open Heart Surgery;  Laterality: N/A;    Current Medications: Current Meds  Medication Sig   acetaminophen (TYLENOL) 325 MG tablet Take 2 tablets (650 mg total) by mouth every 6 (six) hours as needed for mild pain.   aspirin EC 81 MG tablet Take 81 mg by mouth daily. Swallow whole   gabapentin (NEURONTIN) 300 MG capsule Take 300 mg by mouth 2 (two) times daily.   Multiple Vitamin (MULTIVITAMIN WITH MINERALS) TABS tablet Take 1 tablet by mouth daily.   vitamin B-12 (CYANOCOBALAMIN) 1000 MCG tablet Take 1,000 mcg by mouth daily.     Allergies:   Patient has no known allergies.   Social History   Socioeconomic History   Marital status: Married    Spouse name: Not on file   Number of children: Not on file   Years of education: Not on file   Highest education level: Not on file  Occupational History   Not on file  Tobacco Use   Smoking status: Never   Smokeless tobacco: Never  Vaping Use   Vaping Use: Never used  Substance and Sexual Activity   Alcohol use: Never   Drug use: Never   Sexual activity: Not on file  Other Topics Concern   Not on file  Social History Narrative   Not on file   Social Determinants of Health   Financial Resource Strain: Not on file  Food Insecurity: Not on file  Transportation Needs: Not on file  Physical Activity: Not on file  Stress: Not on file  Social Connections: Not on file     Family History: The patient's family history is negative for Hypertension, Heart disease, Diabetes, and  Cancer.  ROS:   Please see the history of present illness.    All other systems reviewed and are negative.  EKGs/Labs/Other Studies Reviewed:    The following studies were reviewed today: EKG reveals sinus rhythm nonspecific ST-T changes   Recent Labs: No results found for requested labs within last 365 days.  Recent Lipid Panel No results found for: "CHOL", "TRIG", "HDL", "CHOLHDL", "VLDL", "LDLCALC", "LDLDIRECT"  Physical Exam:    VS:  BP (!) 166/106   Pulse 65   Ht '5\' 11"'$  (1.803 m)   Wt 177 lb (80.3 kg)   SpO2 95%   BMI 24.69 kg/m     Wt Readings from Last 3 Encounters:  12/23/21 177 lb (80.3 kg)  03/27/18 178 lb 6.4 oz (80.9 kg)  02/22/18 175 lb 11.3 oz (79.7 kg)     GEN: Patient is in no acute  distress HEENT: Normal NECK: No JVD; No carotid bruits LYMPHATICS: No lymphadenopathy CARDIAC: S1 S2 regular, 2/6 systolic murmur at the apex. RESPIRATORY:  Clear to auscultation without rales, wheezing or rhonchi  ABDOMEN: Soft, non-tender, non-distended MUSCULOSKELETAL:  No edema; No deformity  SKIN: Warm and dry NEUROLOGIC:  Alert and oriented x 3 PSYCHIATRIC:  Normal affect    Signed, Jenean Lindau, MD  12/23/2021 9:25 AM    [facility]

## 2021-12-23 NOTE — Patient Instructions (Signed)
Medication Instructions:  Your physician recommends that you continue on your current medications as directed. Please refer to the Current Medication list given to you today.  *If you need a refill on your cardiac medications before your next appointment, please call your pharmacy*   Lab Work: None ordered If you have labs (blood work) drawn today and your tests are completely normal, you will receive your results only by: [place] (if you have MyChart) OR A paper copy in the mail If you have any lab test that is abnormal or we need to change your treatment, we will call you to review the results.   Testing/Procedures: Your physician has requested that you have a lexiscan myoview. For further information please visit [email or web address]. Please follow instruction sheet, as given.  The test will take approximately 3 to 4 hours to complete; you may bring reading material.  If someone comes with you to your appointment, they will need to remain in the main lobby due to limited space in the testing area.   How to prepare for your Myocardial Perfusion Test: Do not eat or drink 3 hours prior to your test, except you may have water. Do not consume products containing caffeine (regular or decaffeinated) 12 hours prior to your test. (ex: coffee, chocolate, sodas, tea). Do bring a list of your current medications with you.  If not listed below, you may take your medications as normal. Do wear comfortable clothes (no dresses or overalls) and walking shoes, tennis shoes preferred (No heels or open toe shoes are allowed). Do NOT wear cologne, perfume, aftershave, or lotions (deodorant is allowed). If these instructions are not followed, your test will have to be rescheduled.  Your physician has requested that you have an echocardiogram. Echocardiography is a painless test that uses sound waves to create images of your heart. It provides your doctor with information about the size and shape of  your heart and how well your heart's chambers and valves are working. This procedure takes approximately one hour. There are no restrictions for this procedure.   Follow-Up: At [facility], you and your health needs are our priority.  As part of our continuing mission to provide you with exceptional heart care, we have created designated Provider Care Teams.  These Care Teams include your primary Cardiologist (physician) and Advanced Practice Providers (APPs -  Physician Assistants and Nurse Practitioners) who all work together to provide you with the care you need, when you need it.  We recommend signing up for the patient portal called "MyChart".  Sign up information is provided on this After Visit Summary.  MyChart is used to connect with patients for Virtual Visits (Telemedicine).  Patients are able to view lab/test results, encounter notes, upcoming appointments, etc.  Non-urgent messages can be sent to your provider as well.   To learn more about what you can do with MyChart, go to [email or web address].    Your next appointment:   6 month(s)  The format for your next appointment:   In Person  Provider:   Jyl Heinz, MD   Other Instructions Cardiac Nuclear Scan A cardiac nuclear scan is a test that is done to check the flow of blood to your heart. It is done when you are resting and when you are exercising. The test looks for problems such as: Not enough blood reaching a portion of the heart. The heart muscle not working as it should. You may need this test if: You have heart disease. You have  had lab results that are not normal. You have had heart surgery or a balloon procedure to open up blocked arteries (angioplasty). You have chest pain. You have shortness of breath. In this test, a special dye (tracer) is put into your bloodstream. The tracer will travel to your heart. A camera will then take pictures of your heart to see how the tracer moves through your heart. This  test is usually done at a hospital and takes 2-4 hours. Tell a doctor about: Any allergies you have. All medicines you are taking, including vitamins, herbs, eye drops, creams, and over-the-counter medicines. Any problems you or family members have had with anesthetic medicines. Any blood disorders you have. Any surgeries you have had. Any medical conditions you have. Whether you are pregnant or may be pregnant. What are the risks? Generally, this is a safe test. However, problems may occur, such as: Serious chest pain and heart attack. This is only a risk if the stress portion of the test is done. Rapid heartbeat. A feeling of warmth in your chest. This feeling usually does not last long. Allergic reaction to the tracer. What happens before the test? Ask your doctor about changing or stopping your normal medicines. This is important. Follow instructions from your doctor about what you cannot eat or drink. Remove your jewelry on the day of the test. What happens during the test? An IV tube will be inserted into one of your veins. Your doctor will give you a small amount of tracer through the IV tube. You will wait for 20-40 minutes while the tracer moves through your bloodstream. Your heart will be monitored with an electrocardiogram (ECG). You will lie down on an exam table. Pictures of your heart will be taken for about 15-20 minutes. You may also have a stress test. For this test, one of these things may be done: You will be asked to exercise on a treadmill or a stationary bike. You will be given medicines that will make your heart work harder. This is done if you are unable to exercise. When blood flow to your heart has peaked, a tracer will again be given through the IV tube. After 20-40 minutes, you will get back on the exam table. More pictures will be taken of your heart. Depending on the tracer that is used, more pictures may need to be taken 3-4 hours later. Your IV tube  will be removed when the test is over. The test may vary among doctors and hospitals. What happens after the test? Ask your doctor: Whether you can return to your normal schedule, including diet, activities, and medicines. Whether you should drink more fluids. This will help to remove the tracer from your body. Drink enough fluid to keep your pee (urine) pale yellow. Ask your doctor, or the department that is doing the test: When will my results be ready? How will I get my results? Summary A cardiac nuclear scan is a test that is done to check the flow of blood to your heart. Tell your doctor whether you are pregnant or may be pregnant. Before the test, ask your doctor about changing or stopping your normal medicines. This is important. Ask your doctor whether you can return to your normal activities. You may be asked to drink more fluids. This information is not intended to replace advice given to you by your health care provider. Make sure you discuss any questions you have with your health care provider. Document Revised: 08/15/2018 Document Reviewed: 10/09/2017   Elsevier Patient Education  2021 Elsevier Inc.    Echocardiogram An echocardiogram is a test that uses sound waves (ultrasound) to produce images of the heart. Images from an echocardiogram can provide important information about: Heart size and shape. The size and thickness and movement of your heart's walls. Heart muscle function and strength. Heart valve function or if you have stenosis. Stenosis is when the heart valves are too narrow. If blood is flowing backward through the heart valves (regurgitation). A tumor or infectious growth around the heart valves. Areas of heart muscle that are not working well because of poor blood flow or injury from a heart attack. Aneurysm detection. An aneurysm is a weak or damaged part of an artery wall. The wall bulges out from the normal force of blood pumping through the body. Tell a  health care provider about: Any allergies you have. All medicines you are taking, including vitamins, herbs, eye drops, creams, and over-the-counter medicines. Any blood disorders you have. Any surgeries you have had. Any medical conditions you have. Whether you are pregnant or may be pregnant. What are the risks? Generally, this is a safe test. However, problems may occur, including an allergic reaction to dye (contrast) that may be used during the test. What happens before the test? No specific preparation is needed. You may eat and drink normally. What happens during the test? You will take off your clothes from the waist up and put on a hospital gown. Electrodes or electrocardiogram (ECG)patches may be placed on your chest. The electrodes or patches are then connected to a device that monitors your heart rate and rhythm. You will lie down on a table for an ultrasound exam. A gel will be applied to your chest to help sound waves pass through your skin. A handheld device, called a transducer, will be pressed against your chest and moved over your heart. The transducer produces sound waves that travel to your heart and bounce back (or "echo" back) to the transducer. These sound waves will be captured in real-time and changed into images of your heart that can be viewed on a video monitor. The images will be recorded on a computer and reviewed by your health care provider. You may be asked to change positions or hold your breath for a short time. This makes it easier to get different views or better views of your heart. In some cases, you may receive contrast through an IV in one of your veins. This can improve the quality of the pictures from your heart. The procedure may vary among health care providers and hospitals.    What can I expect after the test? You may return to your normal, everyday life, including diet, activities, and medicines, unless your health care provider tells you not to do  that. Follow these instructions at home: It is up to you to get the results of your test. Ask your health care provider, or the department that is doing the test, when your results will be ready. Keep all follow-up visits. This is important. Summary An echocardiogram is a test that uses sound waves (ultrasound) to produce images of the heart. Images from an echocardiogram can provide important information about the size and shape of your heart, heart muscle function, heart valve function, and other possible heart problems. You do not need to do anything to prepare before this test. You may eat and drink normally. After the echocardiogram is completed, you may return to your normal, everyday life, unless your   health care provider tells you not to do that. This information is not intended to replace advice given to you by your health care provider. Make sure you discuss any questions you have with your health care provider. Document Revised: 12/17/2019 Document Reviewed: 12/17/2019 Elsevier Patient Education  2021 Elsevier Inc.  

## 2021-12-27 ENCOUNTER — Ambulatory Visit (INDEPENDENT_AMBULATORY_CARE_PROVIDER_SITE_OTHER): Payer: Medicare Other

## 2021-12-27 DIAGNOSIS — Z952 Presence of prosthetic heart valve: Secondary | ICD-10-CM

## 2021-12-27 DIAGNOSIS — Z01810 Encounter for preprocedural cardiovascular examination: Secondary | ICD-10-CM

## 2021-12-27 LAB — ECHOCARDIOGRAM COMPLETE
AR max vel: 1.13 cm2
AV Area VTI: 1.31 cm2
AV Area mean vel: 1.08 cm2
AV Mean grad: 11 mmHg
AV Peak grad: 18.3 mmHg
Ao pk vel: 2.14 m/s
Area-P 1/2: 2.87 cm2
S' Lateral: 3.1 cm

## 2021-12-28 ENCOUNTER — Ambulatory Visit (INDEPENDENT_AMBULATORY_CARE_PROVIDER_SITE_OTHER): Payer: Medicare Other

## 2021-12-28 DIAGNOSIS — Z01810 Encounter for preprocedural cardiovascular examination: Secondary | ICD-10-CM | POA: Diagnosis not present

## 2021-12-28 DIAGNOSIS — I2510 Atherosclerotic heart disease of native coronary artery without angina pectoris: Secondary | ICD-10-CM

## 2021-12-28 LAB — MYOCARDIAL PERFUSION IMAGING
LV dias vol: 96 mL (ref 62–150)
LV sys vol: 42 mL
Nuc Stress EF: 56 %
Peak HR: 92 {beats}/min
Rest HR: 59 {beats}/min
Rest Nuclear Isotope Dose: 10.5 mCi
SDS: 3
SRS: 1
SSS: 4
ST Depression (mm): 0 mm
Stress Nuclear Isotope Dose: 32.2 mCi
TID: 0.95

## 2021-12-28 MED ORDER — TECHNETIUM TC 99M TETROFOSMIN IV KIT
32.2000 | PACK | Freq: Once | INTRAVENOUS | Status: AC | PRN
  Administered 2021-12-28: 32.2 via INTRAVENOUS

## 2021-12-28 MED ORDER — TECHNETIUM TC 99M TETROFOSMIN IV KIT
10.5000 | PACK | Freq: Once | INTRAVENOUS | Status: AC | PRN
  Administered 2021-12-28: 10.5 via INTRAVENOUS

## 2021-12-28 MED ORDER — REGADENOSON 0.4 MG/5ML IV SOLN
0.4000 mg | Freq: Once | INTRAVENOUS | Status: AC
  Administered 2021-12-28: 0.4 mg via INTRAVENOUS

## 2021-12-31 ENCOUNTER — Telehealth: Payer: Self-pay | Admitting: Cardiology

## 2021-12-31 NOTE — Telephone Encounter (Signed)
Will forward to pre op asking pre op provider to please review if the pt has been cleared. The pt saw Dr. Clearence Ped 8/18 and completed his stress test as well.

## 2021-12-31 NOTE — Telephone Encounter (Signed)
Calling to f/u on Clearance for pt's upcoming surgery. Please advise

## 2021-12-31 NOTE — Telephone Encounter (Signed)
   Patient Name: Jon Ferguson  DOB: Jun 18, 1940 MRN: [id number]  Primary Cardiologist: None  Chart reviewed as part of pre-operative protocol coverage. Pre-op clearance already addressed by colleagues in earlier phone notes. To summarize recommendations:  -Per Dr. Geraldo Pitter, stress test and echocardiogram results were both unremarkable.  Patient is cleared to move forward with his surgery and is not deemed at high risk for coronary events.  He also mentions meticulous hemodynamic monitoring will further reduce risk of coronary events.  I will route this to Dr. Geraldo Pitter as well to make sure he agrees.  Will route this bundled recommendation to requesting provider via Epic fax function and remove from pre-op pool. Please call with questions.  Elgie Collard, PA-C 12/31/2021, 12:18 PM

## 2022-01-04 ENCOUNTER — Other Ambulatory Visit: Payer: Medicare Other

## 2022-06-28 ENCOUNTER — Ambulatory Visit: Payer: 59 | Attending: Cardiology | Admitting: Cardiology

## 2022-07-07 ENCOUNTER — Encounter: Payer: Self-pay | Admitting: Cardiology

## 2022-08-31 ENCOUNTER — Encounter: Payer: Self-pay | Admitting: Cardiology

## 2022-08-31 ENCOUNTER — Ambulatory Visit: Payer: 59 | Attending: Cardiology | Admitting: Cardiology

## 2022-08-31 VITALS — BP 148/96 | HR 72 | Ht 71.0 in | Wt 184.4 lb

## 2022-08-31 DIAGNOSIS — Z952 Presence of prosthetic heart valve: Secondary | ICD-10-CM

## 2022-08-31 DIAGNOSIS — Z1321 Encounter for screening for nutritional disorder: Secondary | ICD-10-CM

## 2022-08-31 DIAGNOSIS — Z131 Encounter for screening for diabetes mellitus: Secondary | ICD-10-CM

## 2022-08-31 DIAGNOSIS — I2510 Atherosclerotic heart disease of native coronary artery without angina pectoris: Secondary | ICD-10-CM | POA: Diagnosis not present

## 2022-08-31 DIAGNOSIS — I10 Essential (primary) hypertension: Secondary | ICD-10-CM

## 2022-08-31 DIAGNOSIS — E782 Mixed hyperlipidemia: Secondary | ICD-10-CM

## 2022-08-31 NOTE — Progress Notes (Signed)
Cardiology Office Note:    Date:  08/31/2022   ID:  Jon Ferguson, DOB 06-03-1940, MRN [id number]  PCP:  Jon Puffer, MD  Cardiologist:  Jon Brothers, MD   Referring MD: Jon Puffer, MD    ASSESSMENT:    1. Coronary artery disease involving native coronary artery of native heart without angina pectoris   2. Essential hypertension   3. Mixed dyslipidemia   4. S/P AVR    PLAN:    In order of problems listed above:  Primary prevention stressed with the patient.  Importance of compliance with diet medications stressed and vocalized understanding.  He was advised to walk at least half an hour a day 5 days a week and he promises to do so Essential hypertension: Blood pressure stable and diet was emphasized.  His blood pressures are much better at home.  He has an element of whitecoat hypertension. Post aortic valve replacement: Stable echo report done after last visit was discussed Mixed dyslipidemia: In view of coronary artery disease we will get him back for complete blood work.  He will need statin therapy discussed with him and he wants to think about it. Patient will be seen in follow-up appointment in 6 months or earlier if the patient has any concerns.    Medication Adjustments/Labs and Tests Ordered: Current medicines are reviewed at length with the patient today.  Concerns regarding medicines are outlined above.  No orders of the defined types were placed in this encounter.  No orders of the defined types were placed in this encounter.    No chief complaint on file.    History of Present Illness:    Jon Ferguson is a 82 y.o. male.  Patient has past medical history of coronary artery disease, essential hypertension, mixed dyslipidemia and prostatic valve placement.  He is an active gentleman.  He denies any chest pain orthopnea or PND.  At the time of my evaluation, the patient is alert awake oriented and in no distress.  Past Medical History:  Diagnosis Date    Acute right lumbar radiculopathy    Aortic stenosis    Aortic valve stenosis 02/11/2018   Bilateral inguinal hernia without obstruction or gangrene 07/13/2015   Bladder cancer    Coronary artery disease 12/23/2021   Essential hypertension 07/08/2015   Hypertension    Mixed dyslipidemia 12/23/2021   Osteoarthritis of left glenohumeral joint    Osteoarthritis of right glenohumeral joint    Postoperative examination 09/29/2015   Pre-operative cardiovascular examination 07/08/2015   Preoperative cardiovascular examination 12/23/2021   Rheumatic heart disease    Right inguinal pain 07/13/2015   S/P AVR 02/16/2018   Syncope 02/11/2018   Synovial cyst of lumbar spine    Ventral hernia without obstruction or gangrene 07/13/2015    Past Surgical History:  Procedure Laterality Date   AORTIC VALVE REPLACEMENT N/A 02/16/2018   Procedure: AORTIC VALVE REPLACEMENT (AVR). 25mm EDWARDS LIFESCIENCE INSPIRIS VALVE.;  Surgeon: Loreli Slot, MD;  Location: [facility] OR;  Service: Open Heart Surgery;  Laterality: N/A;   APPENDECTOMY     HERNIA REPAIR     MULTIPLE EXTRACTIONS WITH ALVEOLOPLASTY N/A 02/14/2018   Procedure: Extraction of tooth #'s 27 and 28 with alveoloplasty;  Surgeon: Charlynne Pander, DDS;  Location: [facility] OR;  Service: Oral Surgery;  Laterality: N/A;   RIGHT/LEFT HEART CATH AND CORONARY ANGIOGRAPHY N/A 02/12/2018   Procedure: RIGHT/LEFT HEART CATH AND CORONARY ANGIOGRAPHY;  Surgeon: Orpah Cobb, MD;  Location: [facility] INVASIVE CV LAB;  Service: Cardiovascular;  Laterality: N/A;   TEE WITHOUT CARDIOVERSION N/A 02/13/2018   Procedure: TRANSESOPHAGEAL ECHOCARDIOGRAM (TEE);  Surgeon: Orpah Cobb, MD;  Location[facility] ENDOSCOPY;  Service: Cardiovascular;  Laterality: N/A;   TEE WITHOUT CARDIOVERSION N/A 02/16/2018   Procedure: TRANSESOPHAGEAL ECHOCARDIOGRAM (TEE);  Surgeon: Loreli Slot, MD;  Location: [facility] OR;  Service: Open Heart Surgery;  Laterality: N/A;    Current  Medications: Current Meds  Medication Sig   acetaminophen (TYLENOL) 325 MG tablet Take 2 tablets (650 mg total) by mouth every 6 (six) hours as needed for mild pain.   aspirin 325 MG tablet Take 325 mg by mouth daily.   Multiple Vitamin (MULTIVITAMIN WITH MINERALS) TABS tablet Take 1 tablet by mouth daily.   vitamin B-12 (CYANOCOBALAMIN) 1000 MCG tablet Take 1,000 mcg by mouth daily.     Allergies:   Patient has no known allergies.   Social History   Socioeconomic History   Marital status: Married    Spouse name: Not on file   Number of children: Not on file   Years of education: Not on file   Highest education level: Not on file  Occupational History   Not on file  Tobacco Use   Smoking status: Never   Smokeless tobacco: Never  Vaping Use   Vaping Use: Never used  Substance and Sexual Activity   Alcohol use: Never   Drug use: Never   Sexual activity: Not on file  Other Topics Concern   Not on file  Social History Narrative   Not on file   Social Determinants of Health   Financial Resource Strain: Not on file  Food Insecurity: Not on file  Transportation Needs: Not on file  Physical Activity: Not on file  Stress: Not on file  Social Connections: Not on file     Family History: The patient's family history is negative for Hypertension, Heart disease, Diabetes, and Cancer.  ROS:   Please see the history of present illness.    All other systems reviewed and are negative.  EKGs/Labs/Other Studies Reviewed:    The following studies were reviewed today: I discussed my findings with the patient at length.   Recent Labs: No results found for requested labs within last 365 days.  Recent Lipid Panel No results found for: "CHOL", "TRIG", "HDL", "CHOLHDL", "VLDL", "LDLCALC", "LDLDIRECT"  Physical Exam:    VS:  BP (!) 148/96   Pulse 72   Ht  (1.803 m)   Wt 184 lb 6.4 oz (83.6 kg)   SpO2 97%   BMI 25.72 kg/m     Wt Readings from Last 3 Encounters:   08/31/22 184 lb 6.4 oz (83.6 kg)  12/28/21 177 lb (80.3 kg)  12/23/21 177 lb (80.3 kg)     GEN: Patient is in no acute distress HEENT: Normal NECK: No JVD; No carotid bruits LYMPHATICS: No lymphadenopathy CARDIAC: Hear sounds regular, 2/6 systolic murmur at the apex. RESPIRATORY:  Clear to auscultation without rales, wheezing or rhonchi  ABDOMEN: Soft, non-tender, non-distended MUSCULOSKELETAL:  No edema; No deformity  SKIN: Warm and dry NEUROLOGIC:  Alert and oriented x 3 PSYCHIATRIC:  Normal affect   Signed, Jon Brothers, MD  08/31/2022 1:23 PM    [facility]

## 2022-08-31 NOTE — Patient Instructions (Signed)
Medication Instructions:  Your physician recommends that you continue on your current medications as directed. Please refer to the Current Medication list given to you today.  *If you need a refill on your cardiac medications before your next appointment, please call your pharmacy*   Lab Work: Your physician recommends that you return for lab work in: the next few days You need to have labs done when you are fasting.  You can come Monday through Friday 8:30 am to 12:00 pm and 1:15 to 4:30. You do not need to make an appointment as the order has already been placed. The labs you are going to have done are CMP, CBC, TSH, A1C, vitamin Dand Lipids.  If you have labs (blood work) drawn today and your tests are completely normal, you will receive your results only by: MyChart Message (if you have MyChart) OR A paper copy in the mail If you have any lab test that is abnormal or we need to change your treatment, we will call you to review the results.   Testing/Procedures: None ordered   Follow-Up: At [facility], you and your health needs are our priority.  As part of our continuing mission to provide you with exceptional heart care, we have created designated Provider Care Teams.  These Care Teams include your primary Cardiologist (physician) and Advanced Practice Providers (APPs -  Physician Assistants and Nurse Practitioners) who all work together to provide you with the care you need, when you need it.  We recommend signing up for the patient portal called "MyChart".  Sign up information is provided on this After Visit Summary.  MyChart is used to connect with patients for Virtual Visits (Telemedicine).  Patients are able to view lab/test results, encounter notes, upcoming appointments, etc.  Non-urgent messages can be sent to your provider as well.   To learn more about what you can do with MyChart, go to [email or web address].    Your next appointment:   9 month(s)  The format  for your next appointment:   In Person  Provider:   Rajan Revankar, MD    Other Instructions none  Important Information About Sugar       

## 2022-09-16 LAB — HEMOGLOBIN A1C
Est. average glucose Bld gHb Est-mCnc: 111 mg/dL
Hgb A1c MFr Bld: 5.5 % (ref 4.8–5.6)

## 2022-09-16 LAB — COMPREHENSIVE METABOLIC PANEL
ALT: 23 IU/L (ref 0–44)
AST: 23 IU/L (ref 0–40)
Albumin/Globulin Ratio: 1.8 (ref 1.2–2.2)
Albumin: 4.2 g/dL (ref 3.8–4.8)
Alkaline Phosphatase: 51 IU/L (ref 44–121)
BUN/Creatinine Ratio: 15 (ref 10–24)
BUN: 21 mg/dL (ref 8–27)
Bilirubin Total: 0.5 mg/dL (ref 0.0–1.2)
CO2: 23 mmol/L (ref 20–29)
Calcium: 9 mg/dL (ref 8.6–10.2)
Chloride: 104 mmol/L (ref 96–106)
Creatinine, Ser: 1.37 mg/dL — ABNORMAL HIGH (ref 0.76–1.27)
Globulin, Total: 2.4 g/dL (ref 1.5–4.5)
Glucose: 86 mg/dL (ref 70–99)
Potassium: 3.8 mmol/L (ref 3.5–5.2)
Sodium: 142 mmol/L (ref 134–144)
Total Protein: 6.6 g/dL (ref 6.0–8.5)
eGFR: 52 mL/min/{1.73_m2} — ABNORMAL LOW (ref >59)

## 2022-09-16 LAB — CBC
Hematocrit: 43.5 % (ref 37.5–51.0)
Hemoglobin: 14.6 g/dL (ref 13.0–17.7)
MCH: 31.9 pg (ref 26.6–33.0)
MCHC: 33.6 g/dL (ref 31.5–35.7)
MCV: 95 fL (ref 79–97)
Platelets: 269 10*3/uL (ref 150–450)
RBC: 4.58 x10E6/uL (ref 4.14–5.80)
RDW: 13.2 % (ref 11.6–15.4)
WBC: 7.2 10*3/uL (ref 3.4–10.8)

## 2022-09-16 LAB — LIPID PANEL
Chol/HDL Ratio: 3.2 ratio (ref 0.0–5.0)
Cholesterol, Total: 193 mg/dL (ref 100–199)
HDL: 61 mg/dL (ref >39)
LDL Chol Calc (NIH): 119 mg/dL — ABNORMAL HIGH (ref 0–99)
Triglycerides: 73 mg/dL (ref 0–149)
VLDL Cholesterol Cal: 13 mg/dL (ref 5–40)

## 2022-09-16 LAB — TSH: TSH: 1.75 u[IU]/mL (ref 0.450–4.500)

## 2022-09-16 LAB — VITAMIN D 25 HYDROXY (VIT D DEFICIENCY, FRACTURES): Vit D, 25-Hydroxy: 32.6 ng/mL (ref 30.0–100.0)

## 2022-09-20 ENCOUNTER — Telehealth: Payer: Self-pay

## 2022-09-20 DIAGNOSIS — I2510 Atherosclerotic heart disease of native coronary artery without angina pectoris: Secondary | ICD-10-CM

## 2022-09-20 DIAGNOSIS — E782 Mixed hyperlipidemia: Secondary | ICD-10-CM

## 2022-09-20 MED ORDER — ROSUVASTATIN CALCIUM 10 MG PO TABS
10.0000 mg | ORAL_TABLET | Freq: Every day | ORAL | 3 refills | Status: DC
Start: 2022-09-20 — End: 2023-11-02

## 2022-09-20 NOTE — Telephone Encounter (Signed)
-----   Message from Garwin Brothers, MD sent at 09/19/2022  9:58 AM EDT ----- If he is willing to start statin therapy we can start rosuvastatin 10 mg and liver lipid check in 6 weeks.  Check with him.  He needs it and that is our recommendation.  Copy primary care Garwin Brothers, MD 09/19/2022 9:57 AM

## 2023-03-03 ENCOUNTER — Telehealth: Payer: Self-pay

## 2023-03-03 NOTE — Telephone Encounter (Signed)
Pre-operative Risk Assessment    Patient Name: Jon Ferguson  DOB: October 11, 1940 MRN: [id number]      Request for Surgical Clearance    Procedure:   right reverse total shoulder replacement  Date of Surgery:  Clearance TBD                                 Surgeon:  Dr. Garnette Gunner Surgeon's Group or Practice Name:  [facility] Phone number:  [phone] Fax number:  [phone]   Type of Clearance Requested:   - Medical    Type of Anesthesia:  General    Additional requests/questions:    SignedDione Housekeeper   03/03/2023, 4:24 PM

## 2023-03-03 NOTE — Telephone Encounter (Signed)
Primary Cardiologist:Rajan R Revankar, MD   Preoperative team, please contact this patient and set up a phone call appointment for further preoperative risk assessment. Please obtain consent and complete medication review. Thank you for your help.   Patient takes aspirin 325 mg daily which appears to be continued since AVR 2019. Per office protocol and pending no concerning cardiac symptoms, he may hold aspirin for 5-7 days prior to procedure and should resume as soon as hemodynamically stable postoperatively.  I also confirmed the patient resides in the state of [place]. As per [facility] telemedicine laws, the patient must reside in the state in which the provider is licensed.   Levi Aland, NP-C  03/03/2023, 5:09 PM [address] Office [phone] Fax [phone]

## 2023-03-07 ENCOUNTER — Telehealth: Payer: Self-pay | Admitting: *Deleted

## 2023-03-07 NOTE — Telephone Encounter (Signed)
S/w the pt's wife (DPR) who scheduled the pt a tele pre op appt 03/21/23. Med rec and consent are done. Pt's wife said the surgeon will not schedule surgery until pt has been cleared. Med rec and consent are done.     Patient Consent for Virtual Visit        Jon Ferguson has provided verbal consent on 03/07/2023 for a virtual visit (video or telephone).   CONSENT FOR VIRTUAL VISIT FOR:  Jon Ferguson  By participating in this virtual visit I agree to the following:  I hereby voluntarily request, consent and authorize [facility] and its employed or contracted physicians, physician assistants, nurse practitioners or other licensed health care professionals (the Practitioner), to provide me with telemedicine health care services (the "Services") as deemed necessary by the treating Practitioner. I acknowledge and consent to receive the Services by the Practitioner via telemedicine. I understand that the telemedicine visit will involve communicating with the Practitioner through live audiovisual communication technology and the disclosure of certain medical information by electronic transmission. I acknowledge that I have been given the opportunity to request an in-person assessment or other available alternative prior to the telemedicine visit and am voluntarily participating in the telemedicine visit.  I understand that I have the right to withhold or withdraw my consent to the use of telemedicine in the course of my care at any time, without affecting my right to future care or treatment, and that the Practitioner or I may terminate the telemedicine visit at any time. I understand that I have the right to inspect all information obtained and/or recorded in the course of the telemedicine visit and may receive copies of available information for a reasonable fee.  I understand that some of the potential risks of receiving the Services via telemedicine include:  Delay or interruption in medical  evaluation due to technological equipment failure or disruption; Information transmitted may not be sufficient (e.g. poor resolution of images) to allow for appropriate medical decision making by the Practitioner; and/or  In rare instances, security protocols could fail, causing a breach of personal health information.  Furthermore, I acknowledge that it is my responsibility to provide information about my medical history, conditions and care that is complete and accurate to the best of my ability. I acknowledge that Practitioner's advice, recommendations, and/or decision may be based on factors not within their control, such as incomplete or inaccurate data provided by me or distortions of diagnostic images or specimens that may result from electronic transmissions. I understand that the practice of medicine is not an exact science and that Practitioner makes no warranties or guarantees regarding treatment outcomes. I acknowledge that a copy of this consent can be made available to me via my patient portal [facility] MyChart), or I can request a printed copy by calling the office of [facility].    I understand that my insurance will be billed for this visit.   I have read or had this consent read to me. I understand the contents of this consent, which adequately explains the benefits and risks of the Services being provided via telemedicine.  I have been provided ample opportunity to ask questions regarding this consent and the Services and have had my questions answered to my satisfaction. I give my informed consent for the services to be provided through the use of telemedicine in my medical care

## 2023-03-07 NOTE — Telephone Encounter (Signed)
S/w the pt's wife (DPR) who scheduled the pt a tele pre op appt 03/21/23. Med rec and consent are done. Pt's wife said the surgeon will not schedule surgery until pt has been cleared. Med rec and consent are done.

## 2023-03-07 NOTE — Telephone Encounter (Signed)
Left message to call back to schedule tele pre op appt.,

## 2023-03-08 DIAGNOSIS — Z01818 Encounter for other preprocedural examination: Secondary | ICD-10-CM | POA: Diagnosis not present

## 2023-03-20 NOTE — Progress Notes (Unsigned)
Virtual Visit via Telephone Note   Because of Jon Ferguson's co-morbid illnesses, he is at least at moderate risk for complications without adequate follow up.  This format is felt to be most appropriate for this patient at this time.  The patient did not have access to video technology/had technical difficulties with video requiring transitioning to audio format only (telephone).  All issues noted in this document were discussed and addressed.  No physical exam could be performed with this format.  Please refer to the patient's chart for his consent to telehealth for [facility].  Evaluation Performed:  Preoperative cardiovascular risk assessment _____________   Date:  03/21/2023   Patient ID:  Jon Ferguson, DOB 03-Apr-1941, MRN [id number] Patient Location:  Home Provider location:   Office  Primary Care Provider:  Aida Puffer, MD Primary Cardiologist:  Garwin Brothers, MD  Chief Complaint / Patient Profile   82 y.o. y/o male with a h/o coronary artery disease, essential hypertension, mixed dyslipidemia and prostatic valve placement .  He is pending right reverse total shoulder replacement by Dr. Loralie Champagne of [facility], on date to be determined, and presents today for telephonic preoperative cardiovascular risk assessment.    History of Present Illness    Jon Ferguson is a 82 y.o. male who presents via audio/video conferencing for a telehealth visit today.  Pt was last seen in cardiology clinic on 08/31/2022 by Dr.Revankar.  At that time Jon Ferguson was doing well.   The patient is now pending procedure as outlined above. Since his last visit, he has done very.  He has no cardiac complaints of chest pain, excessive shortness of breath, dizziness, or fatigue.  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  It were not for his shoulder he would be able to do more and he is looking forward to having this taken care of.  Past  Medical History    Past Medical History:  Diagnosis Date   Acute right lumbar radiculopathy    Aortic stenosis    Aortic valve stenosis 02/11/2018   Bilateral inguinal hernia without obstruction or gangrene 07/13/2015   Bladder cancer (HCC)    Coronary artery disease 12/23/2021   Essential hypertension 07/08/2015   Hypertension    Mixed dyslipidemia 12/23/2021   Osteoarthritis of left glenohumeral joint    Osteoarthritis of right glenohumeral joint    Postoperative examination 09/29/2015   Pre-operative cardiovascular examination 07/08/2015   Preoperative cardiovascular examination 12/23/2021   Rheumatic heart disease    Right inguinal pain 07/13/2015   S/P AVR 02/16/2018   Syncope 02/11/2018   Synovial cyst of lumbar spine    Ventral hernia without obstruction or gangrene 07/13/2015   Past Surgical History:  Procedure Laterality Date   AORTIC VALVE REPLACEMENT N/A 02/16/2018   Procedure: AORTIC VALVE REPLACEMENT (AVR). 25mm [employer]WARDS LIF[employer]CI[employer] INSPIRIS VALVE.;  Surgeon: Loreli Slot, MD;  Location: [facility] OR;  Service: Open Heart Surgery;  Laterality: N/A;   APPENDECTOMY     HERNIA REPAIR     MULTIPLE EXTRACTIONS WITH ALVEOLOPLASTY N/A 02/14/2018   Procedure: Extraction of tooth #'s 27 and 28 with alveoloplasty;  Surgeon: Charlynne Pander, DDS;  Location: [facility] OR;  Service: Oral Surgery;  Laterality: N/A;   RIGHT/LEFT HEART CATH AND CORONARY ANGIOGRAPHY N/A 02/12/2018   Procedure: RIGHT/LEFT HEART CATH AND CORONARY ANGIOGRAPHY;  Surgeon: Orpah Cobb, MD;  Location: [facility] INVASIVE CV LAB;  Service: Cardiovascular;  Laterality: N/A;  TEE WITHOUT CARDIOVERSION N/A 02/13/2018   Procedure: TRANSESOPHAGEAL ECHOCARDIOGRAM (TEE);  Surgeon: Orpah Cobb, MD;  Location: [facility] ENDOSCOPY;  Service: Cardiovascular;  Laterality: N/A;   TEE WITHOUT CARDIOVERSION N/A 02/16/2018   Procedure: TRANSESOPHAGEAL ECHOCARDIOGRAM (TEE);  Surgeon: Loreli Slot, MD;  Location: [facility] OR;  Service:  Open Heart Surgery;  Laterality: N/A;    Allergies  No Known Allergies  Home Medications    Prior to Admission medications   Medication Sig Start Date End Date Taking? Authorizing Provider  acetaminophen (TYLENOL) 325 MG tablet Take 2 tablets (650 mg total) by mouth every 6 (six) hours as needed for mild pain. 02/21/18   Ardelle Balls, PA-C  aspirin 325 MG tablet Take 325 mg by mouth daily.    [provider]  Multiple Vitamin (MULTIVITAMIN WITH MINERALS) TABS tablet Take 1 tablet by mouth daily. Patient not taking: Reported on 03/07/2023 02/21/18   Ardelle Balls, PA-C  rosuvastatin (CRESTOR) 10 MG tablet Take 1 tablet (10 mg total) by mouth daily. 09/20/22 09/15/23  Revankar, Aundra Dubin, MD  tamsulosin (FLOMAX) 0.4 MG CAPS capsule Take 0.4 mg by mouth at bedtime. 01/31/23   [provider]  vitamin B-12 (CYANOCOBALAMIN) 1000 MCG tablet Take 1,000 mcg by mouth daily. Patient not taking: Reported on 03/07/2023 04/30/15   [provider]    Physical Exam    Vital Signs:  Jon Ferguson does not have vital signs available for review today.   Given telephonic nature of communication, physical exam is limited. AAOx3. NAD. Normal affect.  Speech and respirations are unlabored.  Accessory Clinical Findings    None  Assessment & Plan    1.  Preoperative Cardiovascular Risk Assessment:  According to the Revised Cardiac Risk Index (RCRI), his Perioperative Risk of Major Cardiac Event is (%): 0.9  His Functional Capacity in METs is: 8.91 according to the Duke Activity Status Index (DASI).   Although not specifically requested by surgery, if necessary, per office protocol, if patient is without any new symptoms or concerns  he may hold ASA for 7 days prior to procedure. Please resume ASA as soon as possible postprocedure, at the discretion of the surgeon.    The patient was advised that if he develops new symptoms prior to surgery to contact our office  to arrange for a follow-up visit, and he verbalized understanding.   No medication cessation recommendations were requested.  A copy of this note will be routed to requesting surgeon.  Time:   Today, I have spent 10 minutes with the patient with telehealth technology discussing medical history, symptoms, and management plan.     Joni Reining, NP  03/21/2023, 2:01 PM

## 2023-03-21 ENCOUNTER — Ambulatory Visit: Payer: 59 | Attending: Cardiology

## 2023-03-21 DIAGNOSIS — Z01818 Encounter for other preprocedural examination: Secondary | ICD-10-CM | POA: Diagnosis not present

## 2023-11-02 ENCOUNTER — Other Ambulatory Visit: Payer: Self-pay | Admitting: Cardiology

## 2023-11-02 DIAGNOSIS — E782 Mixed hyperlipidemia: Secondary | ICD-10-CM

## 2023-11-02 DIAGNOSIS — I2510 Atherosclerotic heart disease of native coronary artery without angina pectoris: Secondary | ICD-10-CM

## 2023-11-03 ENCOUNTER — Other Ambulatory Visit: Payer: Self-pay | Admitting: Cardiology

## 2023-11-03 DIAGNOSIS — E782 Mixed hyperlipidemia: Secondary | ICD-10-CM

## 2023-11-03 DIAGNOSIS — I2510 Atherosclerotic heart disease of native coronary artery without angina pectoris: Secondary | ICD-10-CM

## 2024-01-09 ENCOUNTER — Telehealth: Payer: Self-pay

## 2024-01-09 NOTE — Telephone Encounter (Signed)
 Spoke with pts wife and scheduled IN OFFICE appt for Preop clearance 01/16/24 with Delon hoover, NP  Will update the surgeons office.

## 2024-01-09 NOTE — Telephone Encounter (Signed)
    Primary Cardiologist:Rajan R Revankar, MD  Chart reviewed as part of pre-operative protocol coverage. Because of Jon Ferguson's past medical history and time since last visit, he/she will require a follow-up visit in order to better assess preoperative cardiovascular risk.  Pre-op covering staff: - Please schedule office appointment and call patient to inform them. - Please contact requesting surgeon's office via preferred method (i.e, phone, fax) to inform them of need for appointment prior to surgery.  If applicable, this message will also be routed to pharmacy pool and/or primary cardiologist for input on holding anticoagulant/antiplatelet agent as requested below so that this information is available at time of patient's appointment.   Jon [name], NP  01/09/2024, 1:46 PM

## 2024-01-09 NOTE — Telephone Encounter (Signed)
   Pre-operative Risk Assessment    Patient Name: Jon Ferguson  DOB: Jul 27, 1940 MRN: [id number]   Date of last office visit: 08/31/22 Date of next office visit: N/A   Request for Surgical Clearance    Procedure:  left anatomic versus reverse total shoulder replacement  Date of Surgery:  Clearance TBD                                Surgeon:  Arley Dark, MD Surgeon's Group or Practice Name:  [facility] Phone number:  [phone] Fax number:  [phone]   Type of Clearance Requested:   - Medical    Type of Anesthesia:  General    Additional requests/questions:    SignedAnnabella LITTIE Sayres   01/09/2024, 12:57 PM

## 2024-01-10 NOTE — Progress Notes (Unsigned)
  Cardiology Office Note   Date:  01/16/2024  ID:  Jon Ferguson, DOB 1940-06-09, MRN [id number] PCP: Morgan Lynwood, MD  [facility] Providers Cardiologist:  Lyliana Dicenso JONELLE Crape, MD { Click to update primary MD,subspecialty MD or APP then REFRESH:1}    History of Present Illness Jon Ferguson is a 83 y.o. 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 Renato, [phone]  He is a longstanding patient of Dr. Crape initially established with him in 2017 for newly diagnosed murmur.  In 2019 he underwent AVR for severe aortic stenosis with a 25 mm [employer] Inspiris valve, overall uncomplicated hospitalization but did require some dental extractions.  Most recently was evaluated by Dr. Crape in April 2024, he was stable at this time.  Repeat echocardiogram in September 2024 revealed his valve was functioning appropriately with only mild stenosis.  Echo  ROS: ***  Studies Reviewed       Risk Assessment/Calculations {Does this patient have ATRIAL FIBRILLATION?:[id number]}         Physical Exam VS:  BP 122/82   Pulse 66   Ht 5' 11 (1.803 m)   Wt 182 lb 9.6 oz (82.8 kg)   SpO2 98%   BMI 25.47 kg/m        Wt Readings from Last 3 Encounters:  01/16/24 182 lb 9.6 oz (82.8 kg)  08/31/22 184 lb 6.4 oz (83.6  kg)  12/28/21 177 lb (80.3 kg)    GEN: Well nourished, well developed in no acute distress NECK: No JVD; No carotid bruits CARDIAC: ***RRR, no murmurs, rubs, gallops RESPIRATORY:  Clear to auscultation without rales, wheezing or rhonchi  ABDOMEN: Soft, non-tender, non-distended EXTREMITIES:  No edema; No deformity   ASSESSMENT AND PLAN Aortic stenosis s/p AVR CAD - mild, non-obstructive     {Are you ordering a CV Procedure (e.g. stress test, cath, DCCV, TEE, etc)?   Press F2        :[id number]}  Dispo: ***  Signed, Delon JAYSON Hoover, NP

## 2024-01-16 ENCOUNTER — Ambulatory Visit: Attending: Cardiology | Admitting: Cardiology

## 2024-01-16 ENCOUNTER — Encounter: Payer: Self-pay | Admitting: Cardiology

## 2024-01-16 VITALS — BP 122/82 | HR 66 | Ht 71.0 in | Wt 182.6 lb

## 2024-01-16 DIAGNOSIS — I2510 Atherosclerotic heart disease of native coronary artery without angina pectoris: Secondary | ICD-10-CM

## 2024-01-16 DIAGNOSIS — D6859 Other primary thrombophilia: Secondary | ICD-10-CM

## 2024-01-16 DIAGNOSIS — I060 Rheumatic aortic stenosis: Secondary | ICD-10-CM

## 2024-01-16 DIAGNOSIS — Z952 Presence of prosthetic heart valve: Secondary | ICD-10-CM | POA: Diagnosis not present

## 2024-01-16 DIAGNOSIS — E782 Mixed hyperlipidemia: Secondary | ICD-10-CM

## 2024-01-16 MED ORDER — TAMSULOSIN HCL 0.4 MG PO CAPS: 0.4000 mg | ORAL_CAPSULE | Freq: Every day | ORAL | 12 refills | Status: AC

## 2024-01-16 NOTE — Patient Instructions (Signed)
 Medication Instructions:  Your physician recommends that you continue on your current medications as directed. Please refer to the Current Medication list given to you today.  *If you need a refill on your cardiac medications before your next appointment, please call your pharmacy*   Lab Work: Your physician recommends that you have a CMP and direct LDL today in the office.  If you have labs (blood work) drawn today and your tests are completely normal, you will receive your results only by: MyChart Message (if you have MyChart) OR A paper copy in the mail If you have any lab test that is abnormal or we need to change your treatment, we will call you to review the results.  Testing/Procedures: Your physician has requested that you have an echocardiogram. Echocardiography is a painless test that uses sound waves to create images of your heart. It provides your doctor with information about the size and shape of your heart and how well your heart's chambers and valves are working. This procedure takes approximately one hour. There are no restrictions for this procedure. Please do NOT wear cologne, perfume, aftershave, or lotions (deodorant is allowed). Please arrive 15 minutes prior to your appointment time.  Please note: We ask at that you not bring children with you during ultrasound (echo/ vascular) testing. Due to room size and safety concerns, children are not allowed in the ultrasound rooms during exams. Our front office staff cannot provide observation of children in our lobby area while testing is being conducted. An adult accompanying a patient to their appointment will only be allowed in the ultrasound room at the discretion of the ultrasound technician under special circumstances. We apologize for any inconvenience.  Follow-Up: At [facility], you and your health needs are our priority.  As part of our continuing mission to provide you with exceptional heart care, we have created  designated Provider Care Teams.  These Care Teams include your primary Cardiologist (physician) and Advanced Practice Providers (APPs -  Physician Assistants and Nurse Practitioners) who all work together to provide you with the care you need, when you need it.  We recommend signing up for the patient portal called MyChart.  Sign up information is provided on this After Visit Summary.  MyChart is used to connect with patients for Virtual Visits (Telemedicine).  Patients are able to view lab/test results, encounter notes, upcoming appointments, etc.  Non-urgent messages can be sent to your provider as well.   To learn more about what you can do with MyChart, go to [email or web address].    Your next appointment:   12 month(s)  The format for your next appointment:   In Person  Provider:   Jennifer Crape, MD   Other Instructions Echocardiogram An echocardiogram is a test that uses sound waves (ultrasound) to produce images of the heart. Images from an echocardiogram can provide important information about: Heart size and shape. The size and thickness and movement of your heart's walls. Heart muscle function and strength. Heart valve function or if you have stenosis. Stenosis is when the heart valves are too narrow. If blood is flowing backward through the heart valves (regurgitation). A tumor or infectious growth around the heart valves. Areas of heart muscle that are not working well because of poor blood flow or injury from a heart attack. Aneurysm detection. An aneurysm is a weak or damaged part of an artery wall. The wall bulges out from the normal force of blood pumping through the body. Tell a health care  provider about: Any allergies you have. All medicines you are taking, including vitamins, herbs, eye drops, creams, and over-the-counter medicines. Any blood disorders you have. Any surgeries you have had. Any medical conditions you have. Whether you are pregnant or may be  pregnant. What are the risks? Generally, this is a safe test. However, problems may occur, including an allergic reaction to dye (contrast) that may be used during the test. What happens before the test? No specific preparation is needed. You may eat and drink normally. What happens during the test? You will take off your clothes from the waist up and put on a hospital gown. Electrodes or electrocardiogram (ECG)patches may be placed on your chest. The electrodes or patches are then connected to a device that monitors your heart rate and rhythm. You will lie down on a table for an ultrasound exam. A gel will be applied to your chest to help sound waves pass through your skin. A handheld device, called a transducer, will be pressed against your chest and moved over your heart. The transducer produces sound waves that travel to your heart and bounce back (or echo back) to the transducer. These sound waves will be captured in real-time and changed into images of your heart that can be viewed on a video monitor. The images will be recorded on a computer and reviewed by your health care provider. You may be asked to change positions or hold your breath for a short time. This makes it easier to get different views or better views of your heart. In some cases, you may receive contrast through an IV in one of your veins. This can improve the quality of the pictures from your heart. The procedure may vary among health care providers and hospitals.   What can I expect after the test? You may return to your normal, everyday life, including diet, activities, and medicines, unless your health care provider tells you not to do that. Follow these instructions at home: It is up to you to get the results of your test. Ask your health care provider, or the department that is doing the test, when your results will be ready. Keep all follow-up visits. This is important. Summary An echocardiogram is a test that uses  sound waves (ultrasound) to produce images of the heart. Images from an echocardiogram can provide important information about the size and shape of your heart, heart muscle function, heart valve function, and other possible heart problems. You do not need to do anything to prepare before this test. You may eat and drink normally. After the echocardiogram is completed, you may return to your normal, everyday life, unless your health care provider tells you not to do that. This information is not intended to replace advice given to you by your health care provider. Make sure you discuss any questions you have with your health care provider. Document Revised: 12/17/2019 Document Reviewed: 12/17/2019 Elsevier Patient Education  2021 Elsevier Inc.   Important Information About Sugar

## 2024-01-17 LAB — COMPREHENSIVE METABOLIC PANEL WITH GFR
ALT: 21 IU/L (ref 0–44)
AST: 22 IU/L (ref 0–40)
Albumin: 4.5 g/dL (ref 3.7–4.7)
Alkaline Phosphatase: 49 IU/L (ref 44–121)
BUN/Creatinine Ratio: 15 (ref 10–24)
BUN: 20 mg/dL (ref 8–27)
Bilirubin Total: 0.3 mg/dL (ref 0.0–1.2)
CO2: 25 mmol/L (ref 20–29)
Calcium: 10.1 mg/dL (ref 8.6–10.2)
Chloride: 103 mmol/L (ref 96–106)
Creatinine, Ser: 1.31 mg/dL — ABNORMAL HIGH (ref 0.76–1.27)
Globulin, Total: 2.6 g/dL (ref 1.5–4.5)
Glucose: 90 mg/dL (ref 70–99)
Potassium: 4.8 mmol/L (ref 3.5–5.2)
Sodium: 142 mmol/L (ref 134–144)
Total Protein: 7.1 g/dL (ref 6.0–8.5)
eGFR: 55 mL/min/1.73 — ABNORMAL LOW

## 2024-01-17 LAB — LDL CHOLESTEROL, DIRECT: LDL Direct: 84 mg/dL (ref 0–99)

## 2024-01-18 ENCOUNTER — Ambulatory Visit: Payer: Self-pay | Admitting: Cardiology

## 2024-01-18 DIAGNOSIS — E782 Mixed hyperlipidemia: Secondary | ICD-10-CM

## 2024-01-18 DIAGNOSIS — I2510 Atherosclerotic heart disease of native coronary artery without angina pectoris: Secondary | ICD-10-CM

## 2024-01-18 MED ORDER — ROSUVASTATIN CALCIUM 20 MG PO TABS: 20.0000 mg | ORAL_TABLET | Freq: Every day | ORAL | 3 refills | Status: AC

## 2024-01-18 NOTE — Telephone Encounter (Signed)
-----   Message from Delon JAYSON Hoover sent at 01/18/2024  9:02 AM EDT ----- Cholesterol is slightly higher than I would like for it to be.  Would like for him to increase his Crestor  to 20 mg daily.  Repeat FLP and LFTs in 8 weeks. ----- Message ----- From: Rebecka Memos Lab Results In Sent: 01/17/2024   5:39 AM EDT To: Delon JAYSON Hoover, NP

## 2024-01-18 NOTE — Telephone Encounter (Signed)
 Wife was returning call. Please advise ?

## 2024-01-22 ENCOUNTER — Other Ambulatory Visit: Payer: Self-pay

## 2024-01-24 ENCOUNTER — Telehealth: Payer: Self-pay | Admitting: Cardiology

## 2024-01-24 NOTE — Telephone Encounter (Signed)
 Arica with [facility] called in to ask if they have pt most recent EKG faxed over.   Fax: [phone]

## 2024-01-24 NOTE — Telephone Encounter (Signed)
 Faxed and confirmation received.

## 2024-01-30 ENCOUNTER — Other Ambulatory Visit: Payer: Self-pay | Admitting: Cardiology

## 2024-01-30 DIAGNOSIS — I2510 Atherosclerotic heart disease of native coronary artery without angina pectoris: Secondary | ICD-10-CM

## 2024-01-30 DIAGNOSIS — E782 Mixed hyperlipidemia: Secondary | ICD-10-CM

## 2024-02-08 ENCOUNTER — Ambulatory Visit: Attending: Cardiology

## 2024-02-08 ENCOUNTER — Telehealth: Payer: Self-pay | Admitting: Cardiology

## 2024-02-08 NOTE — Telephone Encounter (Signed)
 I will re-fax notes to the requesting office per requesting office.

## 2024-02-08 NOTE — Telephone Encounter (Signed)
 Office is requesting a callback regarding them needing the full clearance to be faxed again to them. Pt's surgery is Monday 02/12/24. Please advise
